# Patient Record
Sex: Female | Born: 1935 | Race: Black or African American | Hispanic: No | Marital: Single | Smoking: Never smoker
Health system: Southern US, Community
[De-identification: ages and names within clinical notes are randomized; demographics above are authoritative.]

## PROBLEM LIST (undated history)

## (undated) DIAGNOSIS — I7102 Dissection of abdominal aorta: Secondary | ICD-10-CM

## (undated) DIAGNOSIS — N189 Chronic kidney disease, unspecified: Secondary | ICD-10-CM

## (undated) DIAGNOSIS — E113599 Type 2 diabetes mellitus with proliferative diabetic retinopathy without macular edema, unspecified eye: Secondary | ICD-10-CM

## (undated) DIAGNOSIS — I5189 Other ill-defined heart diseases: Secondary | ICD-10-CM

## (undated) DIAGNOSIS — N390 Urinary tract infection, site not specified: Secondary | ICD-10-CM

## (undated) DIAGNOSIS — I10 Essential (primary) hypertension: Secondary | ICD-10-CM

## (undated) DIAGNOSIS — I739 Peripheral vascular disease, unspecified: Secondary | ICD-10-CM

## (undated) DIAGNOSIS — M8580 Other specified disorders of bone density and structure, unspecified site: Secondary | ICD-10-CM

## (undated) DIAGNOSIS — R32 Unspecified urinary incontinence: Secondary | ICD-10-CM

## (undated) DIAGNOSIS — E538 Deficiency of other specified B group vitamins: Secondary | ICD-10-CM

## (undated) DIAGNOSIS — I2510 Atherosclerotic heart disease of native coronary artery without angina pectoris: Secondary | ICD-10-CM

## (undated) DIAGNOSIS — F0390 Unspecified dementia without behavioral disturbance: Secondary | ICD-10-CM

## (undated) DIAGNOSIS — G459 Transient cerebral ischemic attack, unspecified: Secondary | ICD-10-CM

## (undated) DIAGNOSIS — E7800 Pure hypercholesterolemia, unspecified: Secondary | ICD-10-CM

## (undated) DIAGNOSIS — Z531 Procedure and treatment not carried out because of patient's decision for reasons of belief and group pressure: Secondary | ICD-10-CM

## (undated) DIAGNOSIS — M109 Gout, unspecified: Secondary | ICD-10-CM

## (undated) DIAGNOSIS — G8929 Other chronic pain: Secondary | ICD-10-CM

## (undated) DIAGNOSIS — D649 Anemia, unspecified: Secondary | ICD-10-CM

## (undated) DIAGNOSIS — H269 Unspecified cataract: Secondary | ICD-10-CM

## (undated) DIAGNOSIS — E559 Vitamin D deficiency, unspecified: Secondary | ICD-10-CM

## (undated) DIAGNOSIS — K219 Gastro-esophageal reflux disease without esophagitis: Secondary | ICD-10-CM

## (undated) DIAGNOSIS — I77819 Aortic ectasia, unspecified site: Secondary | ICD-10-CM

## (undated) DIAGNOSIS — I639 Cerebral infarction, unspecified: Secondary | ICD-10-CM

## (undated) DIAGNOSIS — M549 Dorsalgia, unspecified: Secondary | ICD-10-CM

## (undated) DIAGNOSIS — N6320 Unspecified lump in the left breast, unspecified quadrant: Secondary | ICD-10-CM

## (undated) DIAGNOSIS — R202 Paresthesia of skin: Secondary | ICD-10-CM

## (undated) DIAGNOSIS — IMO0001 Reserved for inherently not codable concepts without codable children: Secondary | ICD-10-CM

## (undated) DIAGNOSIS — E119 Type 2 diabetes mellitus without complications: Secondary | ICD-10-CM

## (undated) DIAGNOSIS — H401190 Primary open-angle glaucoma, unspecified eye, stage unspecified: Secondary | ICD-10-CM

## (undated) HISTORY — PX: OTHER SURGICAL HISTORY: SHX169

## (undated) HISTORY — PX: HIP SURGERY: SHX245

## (undated) HISTORY — PX: VEIN SURGERY: SHX48

## (undated) HISTORY — PX: EYE SURGERY: SHX253

---

## 2014-10-07 ENCOUNTER — Ambulatory Visit: Payer: Self-pay | Admitting: Emergency Medicine

## 2015-03-21 ENCOUNTER — Emergency Department: Payer: Medicare Other

## 2015-03-21 DIAGNOSIS — W1839XA Other fall on same level, initial encounter: Secondary | ICD-10-CM | POA: Diagnosis not present

## 2015-03-21 DIAGNOSIS — Y998 Other external cause status: Secondary | ICD-10-CM | POA: Diagnosis not present

## 2015-03-21 DIAGNOSIS — S4991XA Unspecified injury of right shoulder and upper arm, initial encounter: Secondary | ICD-10-CM | POA: Insufficient documentation

## 2015-03-21 DIAGNOSIS — Y9389 Activity, other specified: Secondary | ICD-10-CM | POA: Insufficient documentation

## 2015-03-21 DIAGNOSIS — Y9289 Other specified places as the place of occurrence of the external cause: Secondary | ICD-10-CM | POA: Insufficient documentation

## 2015-03-21 NOTE — ED Notes (Signed)
Pt to triage via w/c with no distress; pt st PTA bent over to pick something up and fell on right shoulder;

## 2015-03-22 ENCOUNTER — Emergency Department
Admission: EM | Admit: 2015-03-22 | Discharge: 2015-03-22 | Disposition: A | Discharge status: Home / Self Care | Payer: Medicare Other | Attending: Emergency Medicine | Admitting: Emergency Medicine

## 2015-03-22 DIAGNOSIS — M25511 Pain in right shoulder: Secondary | ICD-10-CM

## 2015-03-22 MED ORDER — OXYCODONE-ACETAMINOPHEN 5-325 MG PO TABS: 1.0000 | ORAL_TABLET | ORAL | Status: AC | PRN

## 2015-03-22 MED ORDER — OXYCODONE-ACETAMINOPHEN 5-325 MG PO TABS
1.0000 | ORAL_TABLET | Freq: Once | ORAL | Status: AC
  Administered 2015-03-22: 1 via ORAL
  Filled 2015-03-22: qty 1

## 2015-03-22 NOTE — ED Provider Notes (Signed)
[facility] Emergency Department Provider Note  ____________________________________________  Time seen: 4:20 AM  I have reviewed the triage vital signs and the nursing notes.   HISTORY  Chief Complaint Shoulder Pain      HPI Jade Baker is a 81 y.o. female presents with history of accidental fall landing on her right shoulder at approximately 10 PM last night. Patient now admits to 8 out of 10 right shoulder pain since the fall.     There are no active problems to display for this patient.    Current Outpatient Rx  Name  Route  Sig  Dispense  Refill  . oxyCODONE-acetaminophen (PERCOCET/ROXICET) 5-325 MG per tablet   Oral   Take 1 tablet by mouth every 4 (four) hours as needed for severe pain.   20 tablet   0     Allergies Codeine and Ivp dye  No family history on file.  Social History History  Substance Use Topics  . Smoking status: Not on file  . Smokeless tobacco: Not on file  . Alcohol Use: Not on file    Review of Systems  Constitutional: Negative for fever. Eyes: Negative for visual changes. ENT: Negative for sore throat. Cardiovascular: Negative for chest pain. Respiratory: Negative for shortness of breath. Gastrointestinal: Negative for abdominal pain, vomiting and diarrhea. Genitourinary: Negative for dysuria. Musculoskeletal: Negative for back pain. Positive right shoulder pain  Skin: Negative for rash. Neurological: Negative for headaches, focal weakness or numbness.   10-point ROS otherwise negative.  ____________________________________________   PHYSICAL EXAM:  VITAL SIGNS: ED Triage Vitals  Enc Vitals Group     BP 03/21/15 2348 154/69 mmHg     Pulse Rate 03/21/15 2348 94     Resp 03/21/15 2348 20     Temp 03/21/15 2348 98 F (36.7 C)     Temp Source 03/21/15 2348 Oral     SpO2 03/21/15 2348 95 %     Weight 03/21/15 2348 135 lb (61.236 kg)     Height 03/21/15 2348 5' (1.524 m)     Head  Cir --      Peak Flow --      Pain Score 03/21/15 2347 10     Pain Loc --      Pain Edu? --      Excl. in GC? --     Constitutional: Alert and oriented. Well appearing and in no distress. Eyes: Conjunctivae are normal. PERRL. Normal extraocular movements. ENT   Head: Normocephalic and atraumatic.   Nose: No congestion/rhinnorhea.   Mouth/Throat: Mucous membranes are moist.   Neck: No stridor. Hematological/Lymphatic/Immunilogical: No cervical lymphadenopathy. Cardiovascular: Normal rate, regular rhythm. Normal and symmetric distal pulses are present in all extremities. No murmurs, rubs, or gallops. Respiratory: Normal respiratory effort without tachypnea nor retractions. Breath sounds are clear and equal bilaterally. No wheezes/rales/rhonchi. Gastrointestinal: Soft and nontender. No distention. There is no CVA tenderness. Genitourinary: deferred Musculoskeletal: Nontender with normal range of motion in all extremities. No joint effusions.   Positive right shoulder pain with palpation, pain with movement of the right arm above 90 Neurologic:  Normal speech and language. No gross focal neurologic deficits are appreciated. Speech is normal.  Skin:  Skin is warm, dry and intact. No rash noted. Psychiatric: Mood and affect are normal. Speech and behavior are normal. Patient exhibits appropriate insight and judgment.    RADIOLOGY  Right shoulder x-ray revealed:     INITIAL IMPRESSION / ASSESSMENT AND PLAN / ED COURSE  Pertinent labs & imaging results that were available during my care of the patient were reviewed by me and considered in my medical decision making (see chart for details).  History of physical exam concerning for possible right shoulder contusion versus rotator cuff injury. As such I conveyed this to the patient and her family and recommended outpatient follow-up with Dr. Trilby Drummer  ____________________________________________   FINAL CLINICAL  IMPRESSION(S) / ED DIAGNOSES  Final diagnoses:  Shoulder pain, acute, right      Darci Current, MD 03/22/15 [phone]

## 2015-03-22 NOTE — ED Notes (Addendum)
Patient presents to ED with complaint of right shoulder pain, reports was using her walker, dropped her plate and when she bent over to pick up plate patient fell on floor and landed on shoulder around 10 pm last night. Denies LOC, chest pain, or abdominal pain. Patient reports painful with movement, no obvious deformity or swelling noted. Good equal bilateral radial pulses. Patient alert and oriented, family at bedside, call bell within reach.

## 2015-03-22 NOTE — ED Notes (Signed)

## 2015-03-22 NOTE — Discharge Instructions (Signed)

## 2015-08-01 ENCOUNTER — Encounter: Payer: Self-pay | Admitting: Emergency Medicine

## 2015-08-01 ENCOUNTER — Observation Stay
Admission: EM | Admit: 2015-08-01 | Discharge: 2015-08-02 | Disposition: A | Discharge status: Home / Self Care | Payer: Medicare Other | Attending: Internal Medicine | Admitting: Internal Medicine

## 2015-08-01 DIAGNOSIS — R32 Unspecified urinary incontinence: Secondary | ICD-10-CM | POA: Insufficient documentation

## 2015-08-01 DIAGNOSIS — I2510 Atherosclerotic heart disease of native coronary artery without angina pectoris: Secondary | ICD-10-CM | POA: Diagnosis not present

## 2015-08-01 DIAGNOSIS — K219 Gastro-esophageal reflux disease without esophagitis: Secondary | ICD-10-CM | POA: Insufficient documentation

## 2015-08-01 DIAGNOSIS — Z7982 Long term (current) use of aspirin: Secondary | ICD-10-CM | POA: Diagnosis not present

## 2015-08-01 DIAGNOSIS — N183 Chronic kidney disease, stage 3 (moderate): Secondary | ICD-10-CM | POA: Insufficient documentation

## 2015-08-01 DIAGNOSIS — G8929 Other chronic pain: Secondary | ICD-10-CM | POA: Diagnosis not present

## 2015-08-01 DIAGNOSIS — M549 Dorsalgia, unspecified: Secondary | ICD-10-CM | POA: Insufficient documentation

## 2015-08-01 DIAGNOSIS — R52 Pain, unspecified: Secondary | ICD-10-CM

## 2015-08-01 DIAGNOSIS — I6521 Occlusion and stenosis of right carotid artery: Secondary | ICD-10-CM | POA: Diagnosis not present

## 2015-08-01 DIAGNOSIS — I1310 Hypertensive heart and chronic kidney disease without heart failure, with stage 1 through stage 4 chronic kidney disease, or unspecified chronic kidney disease: Secondary | ICD-10-CM | POA: Insufficient documentation

## 2015-08-01 DIAGNOSIS — M109 Gout, unspecified: Secondary | ICD-10-CM | POA: Diagnosis not present

## 2015-08-01 DIAGNOSIS — R748 Abnormal levels of other serum enzymes: Secondary | ICD-10-CM | POA: Insufficient documentation

## 2015-08-01 DIAGNOSIS — E1122 Type 2 diabetes mellitus with diabetic chronic kidney disease: Secondary | ICD-10-CM | POA: Insufficient documentation

## 2015-08-01 DIAGNOSIS — Z8673 Personal history of transient ischemic attack (TIA), and cerebral infarction without residual deficits: Secondary | ICD-10-CM | POA: Insufficient documentation

## 2015-08-01 DIAGNOSIS — E559 Vitamin D deficiency, unspecified: Secondary | ICD-10-CM | POA: Diagnosis not present

## 2015-08-01 DIAGNOSIS — R55 Syncope and collapse: Secondary | ICD-10-CM | POA: Diagnosis present

## 2015-08-01 DIAGNOSIS — E7800 Pure hypercholesterolemia, unspecified: Secondary | ICD-10-CM | POA: Insufficient documentation

## 2015-08-01 DIAGNOSIS — E785 Hyperlipidemia, unspecified: Secondary | ICD-10-CM | POA: Diagnosis not present

## 2015-08-01 DIAGNOSIS — M8580 Other specified disorders of bone density and structure, unspecified site: Secondary | ICD-10-CM | POA: Diagnosis not present

## 2015-08-01 DIAGNOSIS — H401190 Primary open-angle glaucoma, unspecified eye, stage unspecified: Secondary | ICD-10-CM | POA: Insufficient documentation

## 2015-08-01 DIAGNOSIS — R778 Other specified abnormalities of plasma proteins: Secondary | ICD-10-CM

## 2015-08-01 DIAGNOSIS — Z7951 Long term (current) use of inhaled steroids: Secondary | ICD-10-CM | POA: Insufficient documentation

## 2015-08-01 DIAGNOSIS — F0390 Unspecified dementia without behavioral disturbance: Secondary | ICD-10-CM | POA: Diagnosis not present

## 2015-08-01 DIAGNOSIS — Z7901 Long term (current) use of anticoagulants: Secondary | ICD-10-CM | POA: Diagnosis not present

## 2015-08-01 DIAGNOSIS — Z79899 Other long term (current) drug therapy: Secondary | ICD-10-CM | POA: Diagnosis not present

## 2015-08-01 DIAGNOSIS — R7989 Other specified abnormal findings of blood chemistry: Secondary | ICD-10-CM | POA: Diagnosis present

## 2015-08-01 DIAGNOSIS — I739 Peripheral vascular disease, unspecified: Secondary | ICD-10-CM | POA: Insufficient documentation

## 2015-08-01 DIAGNOSIS — R110 Nausea: Secondary | ICD-10-CM | POA: Insufficient documentation

## 2015-08-01 HISTORY — DX: Pure hypercholesterolemia, unspecified: E78.00

## 2015-08-01 HISTORY — DX: Unspecified cataract: H26.9

## 2015-08-01 HISTORY — DX: Urinary tract infection, site not specified: N39.0

## 2015-08-01 HISTORY — DX: Other chronic pain: G89.29

## 2015-08-01 HISTORY — DX: Reserved for inherently not codable concepts without codable children: IMO0001

## 2015-08-01 HISTORY — DX: Procedure and treatment not carried out because of patient's decision for reasons of belief and group pressure: Z53.1

## 2015-08-01 HISTORY — DX: Gastro-esophageal reflux disease without esophagitis: K21.9

## 2015-08-01 HISTORY — DX: Vitamin D deficiency, unspecified: E55.9

## 2015-08-01 HISTORY — DX: Dorsalgia, unspecified: M54.9

## 2015-08-01 HISTORY — DX: Atherosclerotic heart disease of native coronary artery without angina pectoris: I25.10

## 2015-08-01 HISTORY — DX: Type 2 diabetes mellitus without complications: E11.9

## 2015-08-01 HISTORY — DX: Chronic kidney disease, unspecified: N18.9

## 2015-08-01 HISTORY — DX: Other specified disorders of bone density and structure, unspecified site: M85.80

## 2015-08-01 HISTORY — DX: Unspecified urinary incontinence: R32

## 2015-08-01 HISTORY — DX: Other ill-defined heart diseases: I51.89

## 2015-08-01 HISTORY — DX: Peripheral vascular disease, unspecified: I73.9

## 2015-08-01 HISTORY — DX: Unspecified dementia, unspecified severity, without behavioral disturbance, psychotic disturbance, mood disturbance, and anxiety: F03.90

## 2015-08-01 HISTORY — DX: Unspecified lump in the left breast, unspecified quadrant: N63.20

## 2015-08-01 HISTORY — DX: Gout, unspecified: M10.9

## 2015-08-01 HISTORY — DX: Essential (primary) hypertension: I10

## 2015-08-01 HISTORY — DX: Cerebral infarction, unspecified: I63.9

## 2015-08-01 HISTORY — DX: Aortic ectasia, unspecified site: I77.819

## 2015-08-01 HISTORY — DX: Deficiency of other specified B group vitamins: E53.8

## 2015-08-01 HISTORY — DX: Type 2 diabetes mellitus with proliferative diabetic retinopathy without macular edema, unspecified eye: E11.3599

## 2015-08-01 HISTORY — DX: Dissection of abdominal aorta: I71.02

## 2015-08-01 HISTORY — DX: Transient cerebral ischemic attack, unspecified: G45.9

## 2015-08-01 HISTORY — DX: Primary open-angle glaucoma, unspecified eye, stage unspecified: H40.1190

## 2015-08-01 HISTORY — DX: Paresthesia of skin: R20.2

## 2015-08-01 HISTORY — DX: Anemia, unspecified: D64.9

## 2015-08-01 LAB — CBC WITH DIFFERENTIAL/PLATELET
Basophils Absolute: 0.1 10*3/uL (ref 0–0.1)
Basophils Relative: 1 %
EOS ABS: 0.2 10*3/uL (ref 0–0.7)
Eosinophils Relative: 3 %
HCT: 25.7 % — ABNORMAL LOW (ref 35.0–47.0)
Hemoglobin: 8.5 g/dL — ABNORMAL LOW (ref 12.0–16.0)
Lymphocytes Relative: 30 %
Lymphs Abs: 2.4 10*3/uL (ref 1.0–3.6)
MCH: 26.8 pg (ref 26.0–34.0)
MCHC: 33.1 g/dL (ref 32.0–36.0)
MCV: 80.9 fL (ref 80.0–100.0)
Monocytes Absolute: 0.7 10*3/uL (ref 0.2–0.9)
Monocytes Relative: 9 %
Neutro Abs: 4.5 10*3/uL (ref 1.4–6.5)
Neutrophils Relative %: 57 %
Platelets: 232 10*3/uL (ref 150–440)
RBC: 3.18 MIL/uL — AB (ref 3.80–5.20)
RDW: 14.6 % — ABNORMAL HIGH (ref 11.5–14.5)
WBC: 7.9 10*3/uL (ref 3.6–11.0)

## 2015-08-01 LAB — URINALYSIS COMPLETE WITH MICROSCOPIC (ARMC ONLY)
Bilirubin Urine: NEGATIVE
GLUCOSE, UA: NEGATIVE mg/dL
Hgb urine dipstick: NEGATIVE
Ketones, ur: NEGATIVE mg/dL
Leukocytes, UA: NEGATIVE
Nitrite: NEGATIVE
Protein, ur: 100 mg/dL — AB
Specific Gravity, Urine: 1.01 (ref 1.005–1.030)
pH: 8 (ref 5.0–8.0)

## 2015-08-01 LAB — CBC
HEMATOCRIT: 24.8 % — AB (ref 35.0–47.0)
Hemoglobin: 8.1 g/dL — ABNORMAL LOW (ref 12.0–16.0)
MCH: 26.8 pg (ref 26.0–34.0)
MCHC: 32.9 g/dL (ref 32.0–36.0)
MCV: 81.3 fL (ref 80.0–100.0)
Platelets: 223 10*3/uL (ref 150–440)
RBC: 3.04 MIL/uL — ABNORMAL LOW (ref 3.80–5.20)
RDW: 14.6 % — AB (ref 11.5–14.5)
WBC: 7 10*3/uL (ref 3.6–11.0)

## 2015-08-01 LAB — COMPREHENSIVE METABOLIC PANEL
ALBUMIN: 4.1 g/dL (ref 3.5–5.0)
ALK PHOS: 78 U/L (ref 38–126)
ALT: 11 U/L — ABNORMAL LOW (ref 14–54)
AST: 22 U/L (ref 15–41)
Anion gap: 8 (ref 5–15)
BUN: 49 mg/dL — ABNORMAL HIGH (ref 6–20)
CALCIUM: 9.7 mg/dL (ref 8.9–10.3)
CO2: 24 mmol/L (ref 22–32)
Chloride: 106 mmol/L (ref 101–111)
Creatinine, Ser: 2.78 mg/dL — ABNORMAL HIGH (ref 0.44–1.00)
GFR calc Af Amer: 17 mL/min — ABNORMAL LOW (ref >60)
GFR calc non Af Amer: 15 mL/min — ABNORMAL LOW (ref >60)
Glucose, Bld: 204 mg/dL — ABNORMAL HIGH (ref 65–99)
Potassium: 4.9 mmol/L (ref 3.5–5.1)
SODIUM: 138 mmol/L (ref 135–145)
Total Bilirubin: 0.3 mg/dL (ref 0.3–1.2)
Total Protein: 7.6 g/dL (ref 6.5–8.1)

## 2015-08-01 LAB — TROPONIN I
TROPONIN I: 0.04 ng/mL — AB (ref <0.031)
Troponin I: 0.04 ng/mL — ABNORMAL HIGH (ref <0.031)
Troponin I: 0.05 ng/mL — ABNORMAL HIGH (ref <0.031)

## 2015-08-01 LAB — CREATININE, SERUM
Creatinine, Ser: 2.67 mg/dL — ABNORMAL HIGH (ref 0.44–1.00)
GFR calc Af Amer: 18 mL/min — ABNORMAL LOW (ref >60)
GFR, EST NON AFRICAN AMERICAN: 16 mL/min — AB (ref >60)

## 2015-08-01 MED ORDER — PREDNISOLONE ACETATE 1 % OP SUSP
1.0000 [drp] | Freq: Four times a day (QID) | OPHTHALMIC | Status: DC
  Administered 2015-08-01 – 2015-08-02 (×2): 1 [drp] via OPHTHALMIC
  Filled 2015-08-01: qty 1

## 2015-08-01 MED ORDER — ACETAMINOPHEN 325 MG PO TABS: 650.0000 mg | ORAL_TABLET | Freq: Four times a day (QID) | ORAL | Status: DC | PRN

## 2015-08-01 MED ORDER — ACETAMINOPHEN 325 MG PO TABS: 650.0000 mg | ORAL_TABLET | ORAL | Status: DC | PRN

## 2015-08-01 MED ORDER — FLUTICASONE PROPIONATE 50 MCG/ACT NA SUSP
1.0000 | Freq: Every day | NASAL | Status: DC | PRN
  Filled 2015-08-01: qty 16

## 2015-08-01 MED ORDER — FERROUS SULFATE 325 (65 FE) MG PO TABS
325.0000 mg | ORAL_TABLET | Freq: Two times a day (BID) | ORAL | Status: DC
  Administered 2015-08-02: 325 mg via ORAL
  Filled 2015-08-01: qty 1

## 2015-08-01 MED ORDER — SODIUM CHLORIDE 0.9 % IJ SOLN
3.0000 mL | Freq: Two times a day (BID) | INTRAMUSCULAR | Status: DC
  Administered 2015-08-01: 22:00:00 3 mL via INTRAVENOUS

## 2015-08-01 MED ORDER — HYDRALAZINE HCL 25 MG PO TABS
50.0000 mg | ORAL_TABLET | Freq: Two times a day (BID) | ORAL | Status: DC
  Administered 2015-08-01 – 2015-08-02 (×2): 50 mg via ORAL
  Filled 2015-08-01 (×2): qty 2

## 2015-08-01 MED ORDER — HEPARIN SODIUM (PORCINE) 5000 UNIT/ML IJ SOLN
5000.0000 [IU] | Freq: Three times a day (TID) | INTRAMUSCULAR | Status: DC
  Administered 2015-08-01 – 2015-08-02 (×2): 5000 [IU] via SUBCUTANEOUS
  Filled 2015-08-01 (×2): qty 1

## 2015-08-01 MED ORDER — CALCIUM CARBONATE ANTACID 500 MG PO CHEW
600.0000 mg | CHEWABLE_TABLET | Freq: Every day | ORAL | Status: DC
  Administered 2015-08-02: 13:00:00 600 mg via ORAL
  Filled 2015-08-01: qty 3

## 2015-08-01 MED ORDER — PAROXETINE HCL 20 MG PO TABS
20.0000 mg | ORAL_TABLET | Freq: Every day | ORAL | Status: DC
Start: 2015-08-01 — End: 2015-08-02
  Administered 2015-08-02: 20 mg via ORAL
  Filled 2015-08-01: qty 1

## 2015-08-01 MED ORDER — MELATONIN 3 MG PO TABS: 1.0000 | ORAL_TABLET | Freq: Every day | ORAL | Status: DC

## 2015-08-01 MED ORDER — MEMANTINE HCL 10 MG PO TABS
10.0000 mg | ORAL_TABLET | Freq: Two times a day (BID) | ORAL | Status: DC
  Administered 2015-08-01 – 2015-08-02 (×2): 10 mg via ORAL
  Filled 2015-08-01 (×2): qty 1

## 2015-08-01 MED ORDER — ONDANSETRON HCL 4 MG PO TABS: 4.0000 mg | ORAL_TABLET | Freq: Four times a day (QID) | ORAL | Status: DC | PRN

## 2015-08-01 MED ORDER — LATANOPROST 0.005 % OP SOLN
1.0000 [drp] | Freq: Every day | OPHTHALMIC | Status: DC
  Administered 2015-08-01: 22:00:00 1 [drp] via OPHTHALMIC
  Filled 2015-08-01: qty 2.5

## 2015-08-01 MED ORDER — SODIUM CHLORIDE 0.9 % IV SOLN
1000.0000 mL | Freq: Once | INTRAVENOUS | Status: AC
  Administered 2015-08-01: 1000 mL via INTRAVENOUS

## 2015-08-01 MED ORDER — DONEPEZIL HCL 5 MG PO TABS
5.0000 mg | ORAL_TABLET | Freq: Every day | ORAL | Status: DC
  Administered 2015-08-01: 5 mg via ORAL
  Filled 2015-08-01: qty 1

## 2015-08-01 MED ORDER — CILOSTAZOL 100 MG PO TABS
100.0000 mg | ORAL_TABLET | Freq: Two times a day (BID) | ORAL | Status: DC
  Administered 2015-08-01 – 2015-08-02 (×2): 100 mg via ORAL
  Filled 2015-08-01 (×3): qty 1

## 2015-08-01 MED ORDER — ISOSORBIDE MONONITRATE ER 30 MG PO TB24
60.0000 mg | ORAL_TABLET | Freq: Every day | ORAL | Status: DC
  Administered 2015-08-02: 60 mg via ORAL
  Filled 2015-08-01: qty 2

## 2015-08-01 MED ORDER — ACETAMINOPHEN 650 MG RE SUPP: 650.0000 mg | Freq: Four times a day (QID) | RECTAL | Status: DC | PRN

## 2015-08-01 MED ORDER — CLOPIDOGREL BISULFATE 75 MG PO TABS
75.0000 mg | ORAL_TABLET | Freq: Every day | ORAL | Status: DC
  Administered 2015-08-02: 13:00:00 75 mg via ORAL
  Filled 2015-08-01: qty 1

## 2015-08-01 MED ORDER — CIPROFLOXACIN HCL 0.3 % OP SOLN
1.0000 [drp] | Freq: Four times a day (QID) | OPHTHALMIC | Status: DC
  Administered 2015-08-01 – 2015-08-02 (×2): 1 [drp] via OPHTHALMIC
  Filled 2015-08-01: qty 2.5

## 2015-08-01 MED ORDER — FEBUXOSTAT 40 MG PO TABS
40.0000 mg | ORAL_TABLET | Freq: Every day | ORAL | Status: DC
  Administered 2015-08-02: 40 mg via ORAL
  Filled 2015-08-01: qty 1

## 2015-08-01 MED ORDER — ASPIRIN EC 81 MG PO TBEC
81.0000 mg | DELAYED_RELEASE_TABLET | Freq: Every day | ORAL | Status: DC
  Administered 2015-08-02: 13:00:00 81 mg via ORAL
  Filled 2015-08-01: qty 1

## 2015-08-01 MED ORDER — ONDANSETRON HCL 4 MG/2ML IJ SOLN: 4.0000 mg | Freq: Four times a day (QID) | INTRAMUSCULAR | Status: DC | PRN

## 2015-08-01 MED ORDER — SIMETHICONE 80 MG PO CHEW: 80.0000 mg | CHEWABLE_TABLET | Freq: Four times a day (QID) | ORAL | Status: DC | PRN

## 2015-08-01 MED ORDER — PRAVASTATIN SODIUM 40 MG PO TABS: 40.0000 mg | ORAL_TABLET | Freq: Every day | ORAL | Status: DC

## 2015-08-01 MED ORDER — BRIMONIDINE TARTRATE 0.2 % OP SOLN
1.0000 [drp] | Freq: Three times a day (TID) | OPHTHALMIC | Status: DC
  Administered 2015-08-01 – 2015-08-02 (×2): 1 [drp] via OPHTHALMIC
  Filled 2015-08-01: qty 5

## 2015-08-01 MED ORDER — VITAMIN D 1000 UNITS PO TABS
1000.0000 [IU] | ORAL_TABLET | Freq: Every day | ORAL | Status: DC
  Administered 2015-08-02: 13:00:00 1000 [IU] via ORAL
  Filled 2015-08-01: qty 1

## 2015-08-01 MED ORDER — PANTOPRAZOLE SODIUM 40 MG PO TBEC
40.0000 mg | DELAYED_RELEASE_TABLET | Freq: Two times a day (BID) | ORAL | Status: DC
  Administered 2015-08-01 – 2015-08-02 (×2): 40 mg via ORAL
  Filled 2015-08-01 (×2): qty 1

## 2015-08-01 NOTE — ED Notes (Signed)
Pt aware of need for urine. Will call when able to go. Instructed not to get up on own.

## 2015-08-01 NOTE — ED Notes (Signed)
Pt was at home and had syncopal episode. Daughter caught so pt did not fall. Denies pain or weakness at this time. Hypotensive in 80s systolic initially by ems. Normotensive with last ems bp

## 2015-08-01 NOTE — H&P (Addendum)
[facility] - [place] at [facility]   PATIENT NAME: Jade Baker    MR#:  [id number]  DATE OF BIRTH:  23-Jun-1931  DATE OF ADMISSION:  08/01/2015  PRIMARY CARE PHYSICIAN: Cyndie Mull, DO   REQUESTING/REFERRING PHYSICIAN: Dr. Chari Manning  CHIEF COMPLAINT:   Syncope.   HISTORY OF PRESENT ILLNESS:  Jade Baker  is a 81 y.o. female with a known history of hypertension, chronic kidney disease stage III, chronic back pain, history of gout, peripheral vascular disease, TIA, urinary incontinence, cataracts, osteoporosis, GERD, history of previous CVA, gout presented to the hospital due to a syncopal episode. Patient says that she was feeling a bit nauseated this morning and then decided to get herself a drink. She pulled herself some ginger ale and the next and she remembers is her niece trying to shake her to get her up. She denies any chest pain, shortness of breath, fevers chills cough, palpitations, or any other associated symptoms prior to her syncopal episode. She said she had a similar episode like this about a year and a half ago. Presently she is hemodynamically stable and denies any acute complaints. Hospitalist services were contacted further treatment and evaluation.  PAST MEDICAL HISTORY:   Past Medical History  Diagnosis Date  . Diabetes mellitus without complication (HCC)   . Hypertension   . CAD (coronary artery disease)   . Chronic back pain   . Hypercholesteremia   . Gout   . PAD (peripheral artery disease) (HCC)   . TIA (transient ischemic attack)   . CKD (chronic kidney disease)   . Urinary incontinence   . Refusal of blood transfusions as patient is Jehovah's Witness   . UTI (lower urinary tract infection)   . PDR (proliferative diabetic retinopathy) (HCC)   . Cataract   . POAG (primary open-angle glaucoma)   . Osteopenia   . Vitamin D deficiency   . Dementia   . Left breast mass   . Anemia   . Aortic ectasia (HCC)   .  Aortic dissection, abdominal (HCC)   . Diastolic dysfunction   . GERD (gastroesophageal reflux disease)   . Vitamin B 12 deficiency   . Leg paresthesia   . Stroke [facility])     PAST SURGICAL HISTORY:   Past Surgical History  Procedure Laterality Date  . Hip surgery    . Vein surgery      per pt from when had stroke  . Eye surgery    . Stimulator for bladder      SOCIAL HISTORY:   Social History  Substance Use Topics  . Smoking status: Never Smoker   . Smokeless tobacco: Not on file  . Alcohol Use: No    FAMILY HISTORY:   Family History  Problem Relation Age of Onset  . Diabetes Mother   . Hypertension Sister   . Diabetes Brother     DRUG ALLERGIES:   Allergies  Allergen Reactions  . Apple Juice [Apple] Anaphylaxis  . Beta Adrenergic Blockers Other (See Comments)    Reaction: Symptomatic bradycardia and hypotension  . Codeine Other (See Comments)    Reaction: Hallucinations  . Hydrochlorothiazide Other (See Comments)    Reaction: Muscle pain   . Niacin And Related Other (See Comments)    Family unsure rxn   . Rofecoxib Other (See Comments)    Family unsure rxn  . Zetia [Ezetimibe] Other (See Comments)    Family unsure rxn  . Zocor [Simvastatin] Other (See Comments)  Reaction: Myalgias  . Iodine Rash  . Ivp Dye [Iodinated Diagnostic Agents] Rash  . Lipitor [Atorvastatin] Other (See Comments)    Reaction: Muscle pain    REVIEW OF SYSTEMS:   Review of Systems  Constitutional: Negative for fever and weight loss.  HENT: Negative for congestion, nosebleeds and tinnitus.   Eyes: Negative for blurred vision, double vision and redness.  Respiratory: Negative for cough, hemoptysis and shortness of breath.   Cardiovascular: Negative for chest pain, orthopnea, leg swelling and PND.       Positive syncope  Gastrointestinal: Negative for nausea, vomiting, abdominal pain, diarrhea and melena.  Genitourinary: Negative for dysuria, urgency and hematuria.   Musculoskeletal: Negative for joint pain and falls.  Neurological: Negative for dizziness, tingling, sensory change, focal weakness, seizures, weakness and headaches.  Endo/Heme/Allergies: Negative for polydipsia. Does not bruise/bleed easily.  Psychiatric/Behavioral: Negative for depression and memory loss. The patient is not nervous/anxious.     MEDICATIONS AT HOME:   Prior to Admission medications   Medication Sig Start Date End Date Taking? Authorizing Provider  acetaminophen (TYLENOL) 325 MG tablet Take 650 mg by mouth every 4 (four) hours as needed for moderate pain.   Yes Historical Provider, MD  aspirin EC 81 MG tablet Take 81 mg by mouth daily.   Yes Historical Provider, MD  brimonidine (ALPHAGAN P) 0.1 % SOLN Place 1 drop into both eyes 3 (three) times daily.   Yes Historical Provider, MD  calcium carbonate (OSCAL) 1500 (600 CA) MG TABS tablet Take 600 mg of elemental calcium by mouth daily.   Yes Historical Provider, MD  cilostazol (PLETAL) 100 MG tablet Take 100 mg by mouth 2 (two) times daily.   Yes Historical Provider, MD  ciprofloxacin (CILOXAN) 0.3 % ophthalmic solution Place 1 drop into the right eye 4 (four) times daily. Administer 1 drop, every 2 hours, while awake, for 2 days. Then 1 drop, every 4 hours, while awake, for the next 5 days.   Yes Historical Provider, MD  clopidogrel (PLAVIX) 75 MG tablet Take 75 mg by mouth daily.   Yes Historical Provider, MD  donepezil (ARICEPT) 5 MG tablet Take 5 mg by mouth at bedtime.   Yes Historical Provider, MD  febuxostat (ULORIC) 40 MG tablet Take 40 mg by mouth daily.   Yes Historical Provider, MD  ferrous sulfate 325 (65 FE) MG tablet Take 325 mg by mouth 2 (two) times daily with a meal.   Yes Historical Provider, MD  fluticasone (FLONASE) 50 MCG/ACT nasal spray Place 1 spray into both nostrils daily as needed for allergies or rhinitis.   Yes Historical Provider, MD  hydrALAZINE (APRESOLINE) 50 MG tablet Take 50 mg by mouth 2 (two)  times daily.   Yes Historical Provider, MD  isosorbide mononitrate (IMDUR) 60 MG 24 hr tablet Take 60 mg by mouth daily.   Yes Historical Provider, MD  latanoprost (XALATAN) 0.005 % ophthalmic solution Place 1 drop into both eyes at bedtime.   Yes Historical Provider, MD  lidocaine (XYLOCAINE) 2 % jelly Apply 1 application topically as needed (for eye).   Yes Historical Provider, MD  lovastatin (MEVACOR) 40 MG tablet Take 40 mg by mouth daily.   Yes Historical Provider, MD  Melatonin 3 MG TABS Take 1 tablet by mouth at bedtime.   Yes Historical Provider, MD  memantine (NAMENDA) 10 MG tablet Take 10 mg by mouth 2 (two) times daily.   Yes Historical Provider, MD  pantoprazole (PROTONIX) 40 MG tablet Take 40  mg by mouth every 12 (twelve) hours.   Yes Historical Provider, MD  PARoxetine (PAXIL) 20 MG tablet Take 20 mg by mouth daily.   Yes Historical Provider, MD  prednisoLONE acetate (PRED FORTE) 1 % ophthalmic suspension Place 1 drop into the right eye 4 (four) times daily.   Yes Historical Provider, MD  simethicone (MYLICON) 80 MG chewable tablet Chew 80 mg by mouth 4 (four) times daily as needed for flatulence.   Yes Historical Provider, MD  Vitamin D, Cholecalciferol, 1000 UNITS CAPS Take 1 capsule by mouth daily.   Yes Historical Provider, MD  oxyCODONE-acetaminophen (PERCOCET/ROXICET) 5-325 MG per tablet Take 1 tablet by mouth every 4 (four) hours as needed for severe pain. Patient not taking: Reported on 08/01/2015 03/22/15   Darci Current, MD      VITAL SIGNS:  Blood pressure 150/65, pulse 90, temperature 97.5 F (36.4 C), temperature source Oral, resp. rate 24, height 5' (1.524 m), weight 54.885 kg (121 lb), SpO2 96 %.  PHYSICAL EXAMINATION:  Physical Exam  GENERAL:  81 y.o.-year-old patient lying in the bed in no acute distress.  EYES: Pupils equal, round, reactive to light and accommodation. No scleral icterus. Extraocular muscles intact.  HEENT: Head atraumatic, normocephalic.  Oropharynx and nasopharynx clear. No oropharyngeal erythema, moist oral mucosa  NECK:  Supple, no jugular venous distention. No thyroid enlargement, no tenderness.  LUNGS: Normal breath sounds bilaterally, no wheezing, rales, rhonchi. No use of accessory muscles of respiration.  CARDIOVASCULAR: S1, S2 RRR. No murmurs, rubs, gallops, clicks.  ABDOMEN: Soft, nontender, nondistended. Bowel sounds present. No organomegaly or mass.  EXTREMITIES: No pedal edema, cyanosis, or clubbing. + 2 pedal & radial pulses b/l.   NEUROLOGIC: Cranial nerves II through XII are intact. No focal Motor or sensory deficits appreciated b/l PSYCHIATRIC: The patient is alert and oriented x 3. Good affect.  SKIN: No obvious rash, lesion, or ulcer.   LABORATORY PANEL:   CBC  Recent Labs Lab 08/01/15 1106  WBC 7.9  HGB 8.5*  HCT 25.7*  PLT 232   ------------------------------------------------------------------------------------------------------------------  Chemistries   Recent Labs Lab 08/01/15 1106  NA 138  K 4.9  CL 106  CO2 24  GLUCOSE 204*  BUN 49*  CREATININE 2.78*  CALCIUM 9.7  AST 22  ALT 11*  ALKPHOS 78  BILITOT 0.3   ------------------------------------------------------------------------------------------------------------------  Cardiac Enzymes  Recent Labs Lab 08/01/15 1751  TROPONINI 0.04*   ------------------------------------------------------------------------------------------------------------------  RADIOLOGY:  No results found.   IMPRESSION AND PLAN:   81 year old female with past medical history of hypertension, gout, chronic kidney disease stage III, dementia, GERD, glaucoma, history of previous CVA, osteoporosis who presented to the hospital due to a syncopal episode.  #1 syncope-the exact etiology of the syncope is unclear. Suspect this is likely vasovagal in nature. -Patient had a nausea episode and shortly thereafter had a presyncopal episode. -Patient  does have significant risk factors given her history of hypertension, chronic kidney disease, peripheral vascular disease. I will therefore observe overnight on telemetry, cycle her cardiac markers. -Check a two-dimensional echocardiogram, carotid duplex. -I will also check orthostatic vital signs.  #2 hypertension-continue hydralazine, Imdur. Next  #3 history of gout-no acute attack. -Continue Uloric.   #4 dementia-continue Namenda, Aricept.  #5 peripheral vascular disease-continue Pletal.  #6 glaucoma-continue Alphagan eyedrops.  #7 GERD-continue Protonix.  #8 hyperlipidemia-continue Pravachol.   All the records are reviewed and case discussed with ED provider. Management plans discussed with the patient, family and they are in agreement.  CODE STATUS: Full  TOTAL TIME TAKING CARE OF THIS PATIENT: 45 minutes.    Houston SirenSAINANI,Yamil Dougher J M.D on 08/01/2015 at 7:29 PM  Between 7am to 6pm - Pager - [phone]  After 6pm go to [email or web address] - password [email or web address] [facility]  [facility]  Office  [phone]  CC: Primary care physician; Luvenia StarchSANTAYANA, GLORIA PATRICIA, DO

## 2015-08-01 NOTE — Progress Notes (Signed)

## 2015-08-01 NOTE — ED Provider Notes (Signed)
-----------------------------------------   6:47 PM on 08/01/2015 -----------------------------------------  [facility] however on further discussions with the transfer center, the patient is unlikely to get a bed anytime soon. She is amenable to being admitted to [facility]. I discussed the case with the hospitalist, Dr. Cherlynn KaiserSainani, at this time for admission.  Gayla DossEryka A Iysha Mishkin, MD 08/01/15 [phone]

## 2015-08-01 NOTE — ED Notes (Signed)
[facility]

## 2015-08-01 NOTE — ED Notes (Signed)
Pt eating

## 2015-08-01 NOTE — ED Notes (Signed)
Dr Mayford Knifewilliams aware pt and family requesting to be [facility] to [facility]

## 2015-08-01 NOTE — ED Notes (Signed)
Meal tray ordered 

## 2015-08-01 NOTE — ED Provider Notes (Addendum)
[facility] Emergency Department Provider Note     Time seen: ----------------------------------------- 11:04 AM on 08/01/2015 -----------------------------------------    I have reviewed the triage vital signs and the nursing notes.   HISTORY  Chief Complaint Near Syncope    HPI Jade Baker is a 81 y.o. female resents ER for syncopal episode. Daughter caught her so she did not fall. She was sitting on her rolling walker and feeling very nauseated. Patient states she then almost passed out and again her daughter caught her so she hurt herself. EMS arrived and she was hypotensive with systolic blood pressure in the 80s. Patient denies any complaints or currently.   No past medical history on file.  There are no active problems to display for this patient.   No past surgical history on file.  Allergies Codeine and Ivp dye  Social History Social History  Substance Use Topics  . Smoking status: Not on file  . Smokeless tobacco: Not on file  . Alcohol Use: Not on file    Review of Systems Constitutional: Negative for fever. Eyes: Negative for visual changes. ENT: Negative for sore throat. Cardiovascular: Negative for chest pain. Positive for near syncope  Respiratory: Negative for shortness of breath. Gastrointestinal: Negative for abdominal pain, vomiting and diarrhea. Positive for nausea Genitourinary: Negative for dysuria. Musculoskeletal: Negative for back pain. Skin: Negative for rash. Neurological: Negative for headaches, focal weakness or numbness.  10-point ROS otherwise negative.  ____________________________________________   PHYSICAL EXAM:  VITAL SIGNS: ED Triage Vitals  Enc Vitals Group     BP 08/01/15 1051 111/55 mmHg     Pulse Rate 08/01/15 1051 85     Resp 08/01/15 1051 17     Temp 08/01/15 1051 97.5 F (36.4 C)     Temp Source 08/01/15 1051 Oral     SpO2 08/01/15 1051 95 %     Weight 08/01/15 1051 121  lb (54.885 kg)     Height 08/01/15 1051 5' (1.524 m)     Head Cir --      Peak Flow --      Pain Score --      Pain Loc --      Pain Edu? --      Excl. in GC? --     Constitutional: Alert and oriented. Well appearing and in no distress. Eyes: Conjunctivae are normal. PERRL. Normal extraocular movements. ENT   Head: Normocephalic and atraumatic.   Nose: No congestion/rhinnorhea.   Mouth/Throat: Mucous membranes are moist.   Neck: No stridor. Cardiovascular: Normal rate, regular rhythm. Normal and symmetric distal pulses are present in all extremities. No murmurs, rubs, or gallops. Respiratory: Normal respiratory effort without tachypnea nor retractions. Breath sounds are clear and equal bilaterally. No wheezes/rales/rhonchi. Gastrointestinal: Soft and nontender. No distention. No abdominal bruits.  Musculoskeletal: Nontender with normal range of motion in all extremities. No joint effusions.  No lower extremity tenderness nor edema. Neurologic:  Normal speech and language. No gross focal neurologic deficits are appreciated. Speech is normal. No gait instability. Skin:  Skin is warm, dry and intact. No rash noted. Psychiatric: Mood and affect are normal. Speech and behavior are normal. Patient exhibits appropriate insight and judgment. ____________________________________________  EKG: Interpreted by me. Sinus rhythm with rate of 79 bpm, normal PR interval, normal QS with, normal QT interval. Nonspecific T-wave changes, LVH, septal Q waves.  ____________________________________________  ED COURSE:  Pertinent labs & imaging results that were available during my care of the patient  were reviewed by me and considered in my medical decision making (see chart for details). Patient is in no acute distress, will need basic labs, EKG and reevaluation. We'll also check orthostatics and a liter saline. ____________________________________________    LABS (pertinent  positives/negatives)  Labs Reviewed  CBC WITH DIFFERENTIAL/PLATELET - Abnormal; Notable for the following:    RBC 3.18 (*)    Hemoglobin 8.5 (*)    HCT 25.7 (*)    RDW 14.6 (*)    All other components within normal limits  COMPREHENSIVE METABOLIC PANEL - Abnormal; Notable for the following:    Glucose, Bld 204 (*)    BUN 49 (*)    Creatinine, Ser 2.78 (*)    ALT 11 (*)    GFR calc non Af Amer 15 (*)    GFR calc Af Amer 17 (*)    All other components within normal limits  TROPONIN I - Abnormal; Notable for the following:    Troponin I 0.04 (*)    All other components within normal limits  URINALYSIS COMPLETEWITH MICROSCOPIC ([facility] ONLY) - Abnormal; Notable for the following:    Color, Urine YELLOW (*)    APPearance CLOUDY (*)    Protein, ur 100 (*)    Bacteria, UA RARE (*)    Squamous Epithelial / LPF 0-5 (*)    All other components within normal limits   ____________________________________________  FINAL ASSESSMENT AND PLAN  Syncope, mildly elevated troponin  Plan: Patient with labs and imaging as dictated above. Possible vasovagal event. Troponin is mildly elevated, would recommend observation to ensure improvement in her symptoms and serial troponin. Patient is anemic, but declines blood transfusion due to being Jehovah's Witness.   Emily FilbertWilliams, Jonathan E, MD   Emily FilbertJonathan E Williams, MD 08/01/15 1215  Emily FilbertJonathan E Williams, MD 08/01/15 [phone]

## 2015-08-01 NOTE — ED Notes (Signed)
Dr. Williams notified of critical trop of 0.04 

## 2015-08-01 NOTE — ED Notes (Signed)
Pt remains alert and oriented.  Family remains at bedside.  Still [facility].  NAD. Skin warm dry and pink. Bed remains low position.  No pain. Respirations unlabored.

## 2015-08-02 ENCOUNTER — Observation Stay: Payer: Medicare Other

## 2015-08-02 ENCOUNTER — Observation Stay
Admit: 2015-08-02 | Discharge: 2015-08-02 | Disposition: A | Discharge status: Home / Self Care | Payer: Medicare Other | Attending: Specialist | Admitting: Specialist

## 2015-08-02 DIAGNOSIS — R55 Syncope and collapse: Secondary | ICD-10-CM | POA: Diagnosis not present

## 2015-08-02 LAB — URINALYSIS COMPLETE WITH MICROSCOPIC (ARMC ONLY)
Bilirubin Urine: NEGATIVE
Glucose, UA: NEGATIVE mg/dL
HGB URINE DIPSTICK: NEGATIVE
Ketones, ur: NEGATIVE mg/dL
LEUKOCYTES UA: NEGATIVE
NITRITE: NEGATIVE
PH: 8 (ref 5.0–8.0)
PROTEIN: 100 mg/dL — AB
SPECIFIC GRAVITY, URINE: 1.009 (ref 1.005–1.030)

## 2015-08-02 LAB — TROPONIN I: Troponin I: 0.05 ng/mL — ABNORMAL HIGH (ref <0.031)

## 2015-08-02 MED ORDER — CIPROFLOXACIN HCL 500 MG PO TABS: 500.0000 mg | ORAL_TABLET | Freq: Two times a day (BID) | ORAL | Status: AC

## 2015-08-02 NOTE — Progress Notes (Signed)
Note history of diabetes. Please consider checking CBG's tid with meals and HS and add Novolog correction sensitive.  Thanks, Beryl MeagerJenny Abbegail Matuska, RN, BC-ADM Inpatient Diabetes Coordinator Pager [phone] (8a-5p)

## 2015-08-02 NOTE — Plan of Care (Signed)
Problem: Education: Goal: Knowledge of [facility] Education information/materials will improve Outcome: Progressing Pt likes to be called Mrs Jimmey Ralpharker.  Pt had Right eye surgery (cataracts surgery) on last Tuesday.    Past Medical History   Diagnosis  Date   .  Diabetes mellitus without complication (HCC)     .  Hypertension     .  CAD (coronary artery disease)     .  Chronic back pain     .  Hypercholesteremia     .  Gout     .  PAD (peripheral artery disease) (HCC)     .  TIA (transient ischemic attack)     .  CKD (chronic kidney disease)     .  Urinary incontinence     .  Refusal of blood transfusions as patient is Jehovah's Witness     .  UTI (lower urinary tract infection)     .  PDR (proliferative diabetic retinopathy) (HCC)     .  Cataract     .  POAG (primary open-angle glaucoma)     .  Osteopenia     .  Vitamin D deficiency     .  Dementia     .  Left breast mass     .  Anemia     .  Aortic ectasia (HCC)     .  Aortic dissection, abdominal (HCC)     .  Diastolic dysfunction     .  GERD (gastroesophageal reflux disease)     .  Vitamin B 12 deficiency     .  Leg paresthesia     .  Stroke (HCC           Pt is well controlled with home medications.  Problem: Activity: Goal: Risk for activity intolerance will decrease Outcome: Progressing Pt alert and oriented. No c/o pain nor distress noted. [facility]. Daughter at bedside. Continue to monitor.

## 2015-08-02 NOTE — Discharge Summary (Signed)
Jade Baker, 81 y.o., DOB 31-Jan-1931, MRN [id number]. Admission date: 08/01/2015 Discharge Date 08/02/2015 Primary MD Cyndie Mull, DO Admitting Physician Houston Siren, MD  Admission Diagnosis  Elevated troponin I level [R79.89] Syncope, unspecified syncope type [R55]  Discharge Diagnosis   Active Problems:   Syncope possible vasovagal Carotid artery stenosis on the right needs outpatient vascular follow-up Hypertension H/o gout Dementia Peripheral vascular disease Glaucoma GERD Hyperlipidemia       Hospital Course Jade Baker is a 81 y.o. female with a known history of hypertension, chronic kidney disease stage III, chronic back pain, history of gout, peripheral vascular disease, TIA, urinary incontinence, cataracts, osteoporosis, GERD, history of previous CVA, gout presented to the hospital due to a syncopal episode. Patient was placed under observation. She underwent a CT of the head which was negative carotid Doppler shows right-sided carotid stenosis which needs outpatient vascular follow-up. There is no evidence of arrhythmia her echocardiogramshows normal EF. At this time patient is doing better and is stable for discharge.            Consults  None  Significant Tests:  See full reports for all details      Ct Head Wo Contrast  08/02/2015  CLINICAL DATA:  Syncope. EXAM: CT HEAD WITHOUT CONTRAST TECHNIQUE: Contiguous axial images were obtained from the base of the skull through the vertex without intravenous contrast. COMPARISON:  None. FINDINGS: There is atrophy and chronic small vessel disease changes. No acute intracranial abnormality. Specifically, no hemorrhage, hydrocephalus, mass lesion, acute infarction, or significant intracranial injury. No acute calvarial abnormality. Visualized paranasal sinuses and mastoids clear. Orbital soft tissues unremarkable. IMPRESSION: No acute intracranial abnormality. Atrophy, chronic microvascular  disease. Electronically Signed   By: Charlett Nose M.D.   On: 08/02/2015 10:50   US Carotid Bilateral  08/02/2015  CLINICAL DATA:  Syncope. EXAM: BILATERAL CAROTID DUPLEX ULTRASOUND TECHNIQUE: Wallace Cullens scale imaging, color Doppler and duplex ultrasound were performed of bilateral carotid and vertebral arteries in the neck. COMPARISON:  None. FINDINGS: Criteria: Quantification of carotid stenosis is based on velocity parameters that correlate the residual internal carotid diameter with NASCET-based stenosis levels, using the diameter of the distal internal carotid lumen as the denominator for stenosis measurement. The following velocity measurements were obtained: RIGHT ICA:  187/34 cm/sec CCA:  107/14 cm/sec SYSTOLIC ICA/CCA RATIO:  1.8 DIASTOLIC ICA/CCA RATIO:  2.5 ECA:  163 cm/sec LEFT ICA:  134/28 cm/sec CCA:  116/16 cm/sec SYSTOLIC ICA/CCA RATIO:  1.2 DIASTOLIC ICA/CCA RATIO:  1.8 ECA:  149 cm/sec RIGHT CAROTID ARTERY: Moderate diffuse plaque with stenosis in the 50-69% range at the carotid bifurcation. RIGHT VERTEBRAL ARTERY:  Patent with antegrade flow . LEFT CAROTID ARTERY: Mild diffuse plaque. Degree of stenosis less than 50% at the carotid bifurcation . LEFT VERTEBRAL ARTERY:  Patent with antegrade flow . IMPRESSION: 1. 50-69% stenosis right carotid bifurcation. Less than 50% stenosis left carotid bifurcation. No hemodynamically significant stenosis noted. 2. Vertebral arteries are patent antegrade flow. Electronically Signed   By: Maisie Fus  Register   On: 08/02/2015 10:58   Dg Shoulder Left  08/02/2015  CLINICAL DATA:  Pt complains of syncope yesterday, "states she must have fallen and hurt her left shoulder. EXAM: LEFT SHOULDER - 2+ VIEW COMPARISON:  None FINDINGS: There is no evidence of fracture or dislocation. There is no evidence of arthropathy or other focal bone abnormality. Soft tissues are unremarkable. IMPRESSION: Negative. Electronically Signed   By: Norva Pavlov M.D.   On: 08/02/2015 11:16  Today   Subjective:   Jade Baker feels well denies any complaints  Objective:   Blood pressure 161/54, pulse 91, temperature 98.9 F (37.2 C), temperature source Oral, resp. rate 22, height 5' (1.524 m), weight 57.97 kg (127 lb 12.8 oz), SpO2 98 %.  .  Intake/Output Summary (Last 24 hours) at 08/02/15 1427 Last data filed at 08/02/15 0800  Gross per 24 hour  Intake    240 ml  Output      0 ml  Net    240 ml    Exam VITAL SIGNS: Blood pressure 161/54, pulse 91, temperature 98.9 F (37.2 C), temperature source Oral, resp. rate 22, height 5' (1.524 m), weight 57.97 kg (127 lb 12.8 oz), SpO2 98 %.  GENERAL:  81 y.o.-year-old patient lying in the bed with no acute distress.  EYES: Pupils equal, round, reactive to light and accommodation. No scleral icterus. Extraocular muscles intact.  HEENT: Head atraumatic, normocephalic. Oropharynx and nasopharynx clear.  NECK:  Supple, no jugular venous distention. No thyroid enlargement, no tenderness.  LUNGS: Normal breath sounds bilaterally, no wheezing, rales,rhonchi or crepitation. No use of accessory muscles of respiration.  CARDIOVASCULAR: S1, S2 normal. No murmurs, rubs, or gallops.  ABDOMEN: Soft, nontender, nondistended. Bowel sounds present. No organomegaly or mass.  EXTREMITIES: No pedal edema, cyanosis, or clubbing.  NEUROLOGIC: Cranial nerves II through XII are intact. Muscle strength 5/5 in all extremities. Sensation intact. Gait not checked.  PSYCHIATRIC: The patient is alert and oriented x 3.  SKIN: No obvious rash, lesion, or ulcer.   Data Review     CBC w Diff: Lab Results  Component Value Date   WBC 7.0 08/01/2015   HGB 8.1* 08/01/2015   HCT 24.8* 08/01/2015   PLT 223 08/01/2015   LYMPHOPCT 30 08/01/2015   MONOPCT 9 08/01/2015   EOSPCT 3 08/01/2015   BASOPCT 1 08/01/2015   CMP: Lab Results  Component Value Date   NA 138 08/01/2015   K 4.9 08/01/2015   CL 106 08/01/2015   CO2 24 08/01/2015   BUN 49*  08/01/2015   CREATININE 2.67* 08/01/2015   PROT 7.6 08/01/2015   ALBUMIN 4.1 08/01/2015   BILITOT 0.3 08/01/2015   ALKPHOS 78 08/01/2015   AST 22 08/01/2015   ALT 11* 08/01/2015  .  Micro Results No results found for this or any previous visit (from the past 240 hour(s)).      Code Status Orders        Start     Ordered   08/01/15 2029  Full code   Continuous     08/01/15 2028    Advance Directive Documentation        Most Recent Value   Type of Advance Directive  Healthcare Power of Attorney [Daughter: Gerldine Spate]   Pre-existing out of facility DNR order (yellow form or pink MOST form)     "MOST" Form in Place?            Follow-up Information    Follow up with Luvenia StarchSANTAYANA, GLORIA PATRICIA, DO On 08/09/2015.   Specialty:  Family Medicine   Why:  at 1:20   Contact information:   [address] [place] [id number] [phone]       Follow up with vascular surgon at [facility] In 3 weeks.   Why:  for right sided cartoid stenosis      Discharge Medications     Medication List    TAKE these medications  acetaminophen 325 MG tablet  Commonly known as:  TYLENOL  Take 650 mg by mouth every 4 (four) hours as needed for moderate pain.     aspirin EC 81 MG tablet  Take 81 mg by mouth daily.     brimonidine 0.1 % Soln  Commonly known as:  ALPHAGAN P  Place 1 drop into both eyes 3 (three) times daily.     calcium carbonate 1500 (600 CA) MG Tabs tablet  Commonly known as:  OSCAL  Take 600 mg of elemental calcium by mouth daily.     cilostazol 100 MG tablet  Commonly known as:  PLETAL  Take 100 mg by mouth 2 (two) times daily.     ciprofloxacin 0.3 % ophthalmic solution  Commonly known as:  CILOXAN  Place 1 drop into the right eye 4 (four) times daily. Administer 1 drop, every 2 hours, while awake, for 2 days. Then 1 drop, every 4 hours, while awake, for the next 5 days.     ciprofloxacin 500 MG tablet  Commonly known as:  CIPRO  Take 1 tablet  (500 mg total) by mouth 2 (two) times daily.     clopidogrel 75 MG tablet  Commonly known as:  PLAVIX  Take 75 mg by mouth daily.     donepezil 5 MG tablet  Commonly known as:  ARICEPT  Take 5 mg by mouth at bedtime.     febuxostat 40 MG tablet  Commonly known as:  ULORIC  Take 40 mg by mouth daily.     ferrous sulfate 325 (65 FE) MG tablet  Take 325 mg by mouth 2 (two) times daily with a meal.     fluticasone 50 MCG/ACT nasal spray  Commonly known as:  FLONASE  Place 1 spray into both nostrils daily as needed for allergies or rhinitis.     hydrALAZINE 50 MG tablet  Commonly known as:  APRESOLINE  Take 50 mg by mouth 2 (two) times daily.     isosorbide mononitrate 60 MG 24 hr tablet  Commonly known as:  IMDUR  Take 60 mg by mouth daily.     latanoprost 0.005 % ophthalmic solution  Commonly known as:  XALATAN  Place 1 drop into both eyes at bedtime.     lidocaine 2 % jelly  Commonly known as:  XYLOCAINE  Apply 1 application topically as needed (for eye).     lovastatin 40 MG tablet  Commonly known as:  MEVACOR  Take 40 mg by mouth daily.     Melatonin 3 MG Tabs  Take 1 tablet by mouth at bedtime.     memantine 10 MG tablet  Commonly known as:  NAMENDA  Take 10 mg by mouth 2 (two) times daily.     oxyCODONE-acetaminophen 5-325 MG tablet  Commonly known as:  PERCOCET/ROXICET  Take 1 tablet by mouth every 4 (four) hours as needed for severe pain.     pantoprazole 40 MG tablet  Commonly known as:  PROTONIX  Take 40 mg by mouth every 12 (twelve) hours.     PARoxetine 20 MG tablet  Commonly known as:  PAXIL  Take 20 mg by mouth daily.     prednisoLONE acetate 1 % ophthalmic suspension  Commonly known as:  PRED FORTE  Place 1 drop into the right eye 4 (four) times daily.     simethicone 80 MG chewable tablet  Commonly known as:  MYLICON  Chew 80 mg by mouth 4 (four) times daily as  needed for flatulence.     Vitamin D (Cholecalciferol) 1000 UNITS Caps  Take  1 capsule by mouth daily.           Total Time in preparing paper work, data evaluation and todays exam - 35 minutes  Auburn Bilberry M.D on 08/02/2015 at 2:27 [facility]  [facility]   Office  [phone]

## 2015-08-02 NOTE — Care Management Obs Status (Signed)
MEDICARE OBSERVATION STATUS NOTIFICATION   Patient Details  Name: Jade Baker MRN: [id number] Date of Birth: [birth year]   Medicare Observation Status Notification Given:  Yes    Gwenette GreetBrenda S Benny Henrie, RN 08/02/2015, 3:15 PM

## 2015-08-02 NOTE — Discharge Instructions (Signed)
°  DIET:  Cardiac diet  DISCHARGE CONDITION:  Stable  ACTIVITY:  Activity as tolerated  OXYGEN:  Home Oxygen: No.   Oxygen Delivery: room air  DISCHARGE LOCATION:  home    ADDITIONAL DISCHARGE INSTRUCTION: [facility] sided [facility]  If you experience worsening of your admission symptoms, develop shortness of breath, life threatening emergency, suicidal or homicidal thoughts you must seek medical attention immediately by calling 911 or calling your MD immediately  if symptoms less severe.  You Must read complete instructions/literature along with all the possible adverse reactions/side effects for all the Medicines you take and that have been prescribed to you. Take any new Medicines after you have completely understood and accpet all the possible adverse reactions/side effects.   Please note  You were cared for by a hospitalist during your hospital stay. If you have any questions about your discharge medications or the care you received while you were in the hospital after you are discharged, you can call the unit and asked to speak with the hospitalist on call if the hospitalist that took care of you is not available. Once you are discharged, your primary care physician will handle any further medical issues. Please note that NO REFILLS for any discharge medications will be authorized once you are discharged, as it is imperative that you return to your primary care physician (or establish a relationship with a primary care physician if you do not have one) for your aftercare needs so that they can reassess your need for medications and monitor your lab values.

## 2015-08-29 ENCOUNTER — Other Ambulatory Visit
Admission: RE | Admit: 2015-08-29 | Discharge: 2015-08-29 | Disposition: A | Discharge status: Home / Self Care | Payer: Medicare Other | Source: Ambulatory Visit | Attending: Physician Assistant | Admitting: Physician Assistant

## 2015-08-29 DIAGNOSIS — R300 Dysuria: Secondary | ICD-10-CM | POA: Insufficient documentation

## 2015-08-29 LAB — URINALYSIS COMPLETE WITH MICROSCOPIC (ARMC ONLY)
BILIRUBIN URINE: NEGATIVE
GLUCOSE, UA: NEGATIVE mg/dL
Hgb urine dipstick: NEGATIVE
KETONES UR: NEGATIVE mg/dL
Nitrite: NEGATIVE
PH: 7 (ref 5.0–8.0)
Protein, ur: 100 mg/dL — AB
RBC / HPF: NONE SEEN RBC/hpf (ref 0–5)
Specific Gravity, Urine: 1.006 (ref 1.005–1.030)

## 2015-08-31 LAB — URINE CULTURE: Culture: >=100000

## 2016-03-18 ENCOUNTER — Other Ambulatory Visit
Admission: RE | Admit: 2016-03-18 | Discharge: 2016-03-18 | Disposition: A | Discharge status: Home / Self Care | Payer: Medicare Other | Source: Ambulatory Visit | Attending: Physician Assistant | Admitting: Physician Assistant

## 2016-03-18 DIAGNOSIS — M810 Age-related osteoporosis without current pathological fracture: Secondary | ICD-10-CM | POA: Insufficient documentation

## 2016-03-18 DIAGNOSIS — K5900 Constipation, unspecified: Secondary | ICD-10-CM | POA: Insufficient documentation

## 2016-03-18 DIAGNOSIS — R2681 Unsteadiness on feet: Secondary | ICD-10-CM | POA: Insufficient documentation

## 2016-03-18 DIAGNOSIS — M109 Gout, unspecified: Secondary | ICD-10-CM | POA: Diagnosis present

## 2016-03-18 DIAGNOSIS — I10 Essential (primary) hypertension: Secondary | ICD-10-CM | POA: Insufficient documentation

## 2016-03-18 DIAGNOSIS — J309 Allergic rhinitis, unspecified: Secondary | ICD-10-CM | POA: Diagnosis present

## 2016-03-18 DIAGNOSIS — E559 Vitamin D deficiency, unspecified: Secondary | ICD-10-CM | POA: Diagnosis present

## 2016-03-18 DIAGNOSIS — I2510 Atherosclerotic heart disease of native coronary artery without angina pectoris: Secondary | ICD-10-CM | POA: Insufficient documentation

## 2016-03-18 DIAGNOSIS — K219 Gastro-esophageal reflux disease without esophagitis: Secondary | ICD-10-CM | POA: Insufficient documentation

## 2016-03-18 DIAGNOSIS — E119 Type 2 diabetes mellitus without complications: Secondary | ICD-10-CM | POA: Insufficient documentation

## 2016-03-18 DIAGNOSIS — I739 Peripheral vascular disease, unspecified: Secondary | ICD-10-CM | POA: Insufficient documentation

## 2016-03-18 DIAGNOSIS — R52 Pain, unspecified: Secondary | ICD-10-CM | POA: Insufficient documentation

## 2016-03-18 DIAGNOSIS — F329 Major depressive disorder, single episode, unspecified: Secondary | ICD-10-CM | POA: Diagnosis present

## 2016-03-18 DIAGNOSIS — D509 Iron deficiency anemia, unspecified: Secondary | ICD-10-CM | POA: Diagnosis present

## 2016-03-18 DIAGNOSIS — H401113 Primary open-angle glaucoma, right eye, severe stage: Secondary | ICD-10-CM | POA: Diagnosis present

## 2016-03-18 DIAGNOSIS — E785 Hyperlipidemia, unspecified: Secondary | ICD-10-CM | POA: Diagnosis present

## 2016-03-18 DIAGNOSIS — F0281 Dementia in other diseases classified elsewhere with behavioral disturbance: Secondary | ICD-10-CM | POA: Insufficient documentation

## 2016-03-18 DIAGNOSIS — N184 Chronic kidney disease, stage 4 (severe): Secondary | ICD-10-CM | POA: Insufficient documentation

## 2016-03-18 DIAGNOSIS — R079 Chest pain, unspecified: Secondary | ICD-10-CM | POA: Insufficient documentation

## 2016-03-18 DIAGNOSIS — M6281 Muscle weakness (generalized): Secondary | ICD-10-CM | POA: Insufficient documentation

## 2016-03-18 DIAGNOSIS — G4700 Insomnia, unspecified: Secondary | ICD-10-CM | POA: Insufficient documentation

## 2016-03-18 LAB — URINALYSIS COMPLETE WITH MICROSCOPIC (ARMC ONLY)
Bilirubin Urine: NEGATIVE
GLUCOSE, UA: NEGATIVE mg/dL
KETONES UR: NEGATIVE mg/dL
NITRITE: NEGATIVE
Protein, ur: 100 mg/dL — AB
SPECIFIC GRAVITY, URINE: 1.009 (ref 1.005–1.030)
pH: 8 (ref 5.0–8.0)

## 2016-03-19 ENCOUNTER — Other Ambulatory Visit
Admission: RE | Admit: 2016-03-19 | Discharge: 2016-03-19 | Disposition: A | Discharge status: Home / Self Care | Payer: Medicare Other | Source: Ambulatory Visit | Attending: Nurse Practitioner | Admitting: Nurse Practitioner

## 2016-03-19 DIAGNOSIS — R4182 Altered mental status, unspecified: Secondary | ICD-10-CM | POA: Diagnosis present

## 2016-03-19 LAB — CBC WITH DIFFERENTIAL/PLATELET
BASOS ABS: 0 10*3/uL (ref 0–0.1)
Basophils Relative: 0 %
Eosinophils Absolute: 0.3 10*3/uL (ref 0–0.7)
Eosinophils Relative: 4 %
HEMATOCRIT: 28.1 % — AB (ref 35.0–47.0)
HEMOGLOBIN: 8.9 g/dL — AB (ref 12.0–16.0)
LYMPHS PCT: 23 %
Lymphs Abs: 1.9 10*3/uL (ref 1.0–3.6)
MCH: 27.6 pg (ref 26.0–34.0)
MCHC: 31.6 g/dL — ABNORMAL LOW (ref 32.0–36.0)
MCV: 87.2 fL (ref 80.0–100.0)
MONOS PCT: 9 %
Monocytes Absolute: 0.8 10*3/uL (ref 0.2–0.9)
Neutro Abs: 5.4 10*3/uL (ref 1.4–6.5)
Neutrophils Relative %: 64 %
Platelets: 258 10*3/uL (ref 150–440)
RBC: 3.22 MIL/uL — AB (ref 3.80–5.20)
RDW: 15.7 % — ABNORMAL HIGH (ref 11.5–14.5)
WBC: 8.4 10*3/uL (ref 3.6–11.0)

## 2016-03-19 LAB — COMPREHENSIVE METABOLIC PANEL
ALBUMIN: 3.8 g/dL (ref 3.5–5.0)
ALK PHOS: 71 U/L (ref 38–126)
ALT: 13 U/L — ABNORMAL LOW (ref 14–54)
AST: 21 U/L (ref 15–41)
Anion gap: 12 (ref 5–15)
BILIRUBIN TOTAL: 0.3 mg/dL (ref 0.3–1.2)
BUN: 73 mg/dL — AB (ref 6–20)
CALCIUM: 11.1 mg/dL — AB (ref 8.9–10.3)
CO2: 22 mmol/L (ref 22–32)
Chloride: 101 mmol/L (ref 101–111)
Creatinine, Ser: 3.3 mg/dL — ABNORMAL HIGH (ref 0.44–1.00)
GFR calc Af Amer: 14 mL/min — ABNORMAL LOW (ref >60)
GFR calc non Af Amer: 12 mL/min — ABNORMAL LOW (ref >60)
GLUCOSE: 187 mg/dL — AB (ref 65–99)
POTASSIUM: 4.6 mmol/L (ref 3.5–5.1)
Sodium: 135 mmol/L (ref 135–145)
TOTAL PROTEIN: 7.5 g/dL (ref 6.5–8.1)

## 2016-03-21 LAB — URINE CULTURE: Culture: >=100000 — AB

## 2016-03-26 ENCOUNTER — Other Ambulatory Visit
Admission: RE | Admit: 2016-03-26 | Discharge: 2016-03-26 | Disposition: A | Discharge status: Home / Self Care | Payer: Medicare Other | Source: Ambulatory Visit | Attending: Nurse Practitioner | Admitting: Nurse Practitioner

## 2016-03-26 DIAGNOSIS — R5383 Other fatigue: Secondary | ICD-10-CM | POA: Insufficient documentation

## 2016-03-26 LAB — CBC WITH DIFFERENTIAL/PLATELET
BASOS PCT: 1 %
Basophils Absolute: 0.1 10*3/uL (ref 0–0.1)
EOS ABS: 0.1 10*3/uL (ref 0–0.7)
EOS PCT: 2 %
HCT: 30.6 % — ABNORMAL LOW (ref 35.0–47.0)
Hemoglobin: 10.1 g/dL — ABNORMAL LOW (ref 12.0–16.0)
LYMPHS ABS: 1.4 10*3/uL (ref 1.0–3.6)
Lymphocytes Relative: 17 %
MCH: 28.8 pg (ref 26.0–34.0)
MCHC: 33.1 g/dL (ref 32.0–36.0)
MCV: 87.1 fL (ref 80.0–100.0)
MONO ABS: 0.7 10*3/uL (ref 0.2–0.9)
MONOS PCT: 8 %
Neutro Abs: 6.2 10*3/uL (ref 1.4–6.5)
Neutrophils Relative %: 72 %
Platelets: 284 10*3/uL (ref 150–440)
RBC: 3.52 MIL/uL — ABNORMAL LOW (ref 3.80–5.20)
RDW: 16.8 % — AB (ref 11.5–14.5)
WBC: 8.6 10*3/uL (ref 3.6–11.0)

## 2016-03-26 LAB — BASIC METABOLIC PANEL
Anion gap: 10 (ref 5–15)
BUN: 68 mg/dL — AB (ref 6–20)
CALCIUM: 10.7 mg/dL — AB (ref 8.9–10.3)
CHLORIDE: 101 mmol/L (ref 101–111)
CO2: 21 mmol/L — AB (ref 22–32)
CREATININE: 3.93 mg/dL — AB (ref 0.44–1.00)
GFR calc non Af Amer: 10 mL/min — ABNORMAL LOW (ref >60)
GFR, EST AFRICAN AMERICAN: 11 mL/min — AB (ref >60)
GLUCOSE: 207 mg/dL — AB (ref 65–99)
Potassium: 5 mmol/L (ref 3.5–5.1)
Sodium: 132 mmol/L — ABNORMAL LOW (ref 135–145)

## 2016-05-02 IMAGING — CT CT HEAD W/O CM
2 series · 14 of 30 positions shown, 16 images · non-contrast
Comparison: None.

CLINICAL DATA: Syncope.

EXAM:
CT HEAD WITHOUT CONTRAST
TECHNIQUE: Contiguous axial images were obtained from the base of the skull
through the vertex without intravenous contrast.

[Series 2: head wo · axial · 0.37mm/px · z∈[+372,+468]mm · 6 of 27 slices shown, 8 images]
[im 4/27  brain]
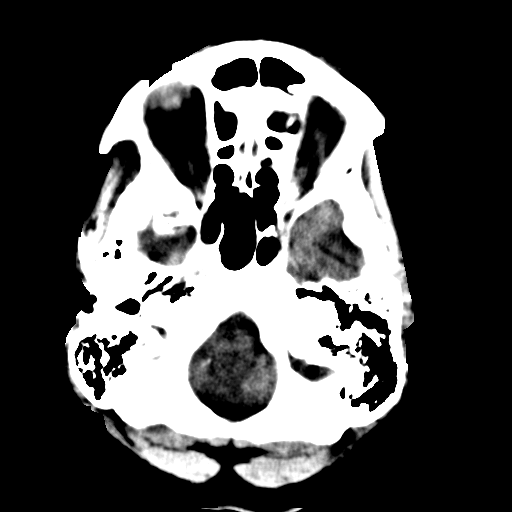
[im 4/27  bone]
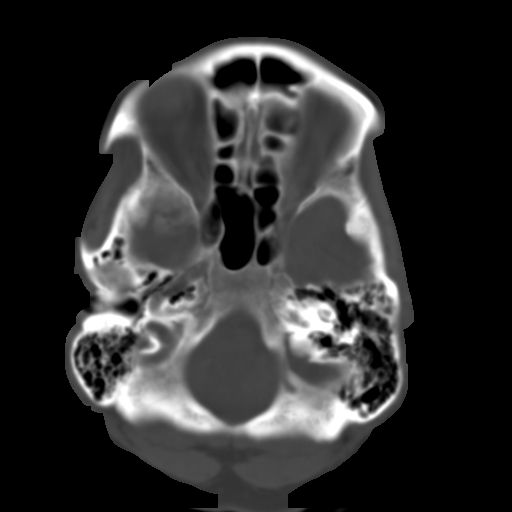
[im 8/27  brain]
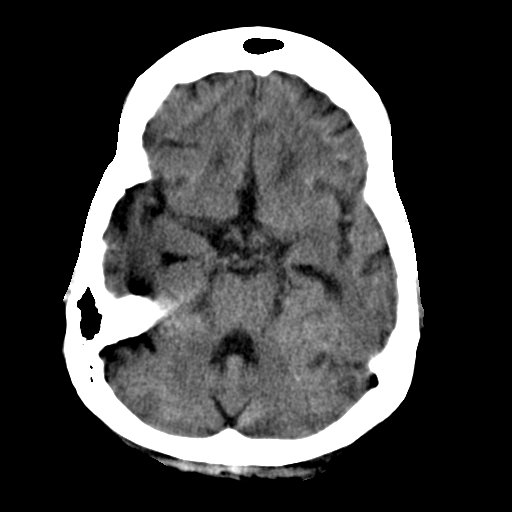
[im 12/27  brain]
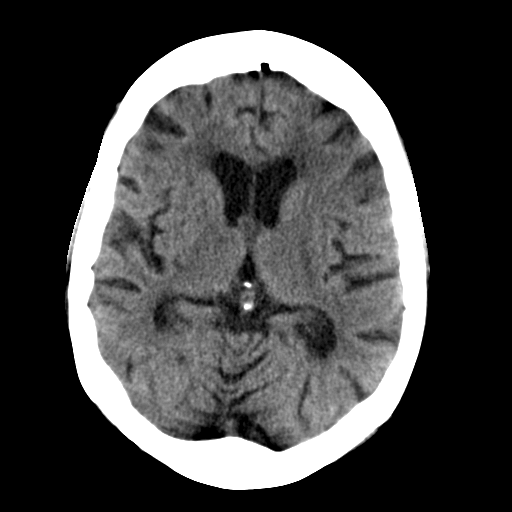
[im 15/27  brain]
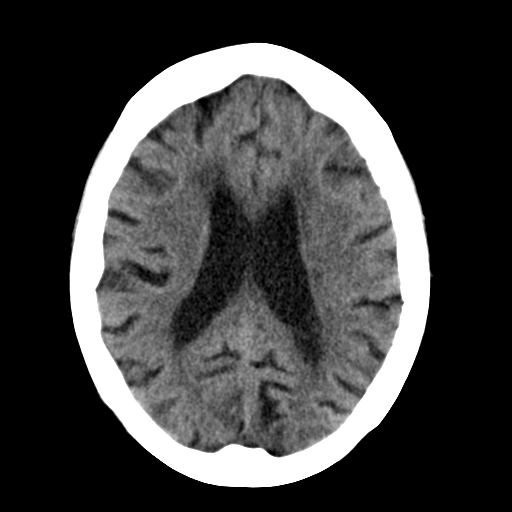
[im 19/27  brain]
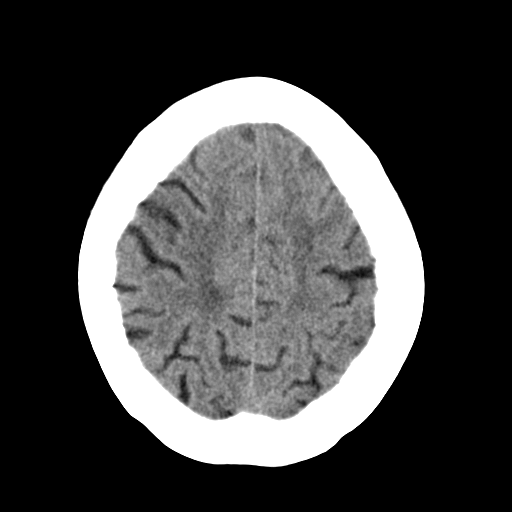
[im 19/27  bone]
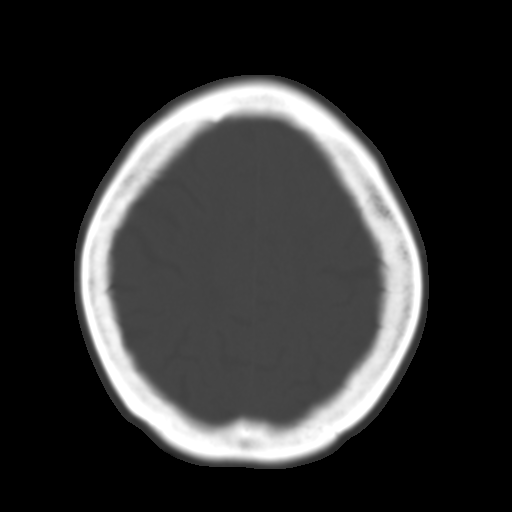
[im 23/27  brain]
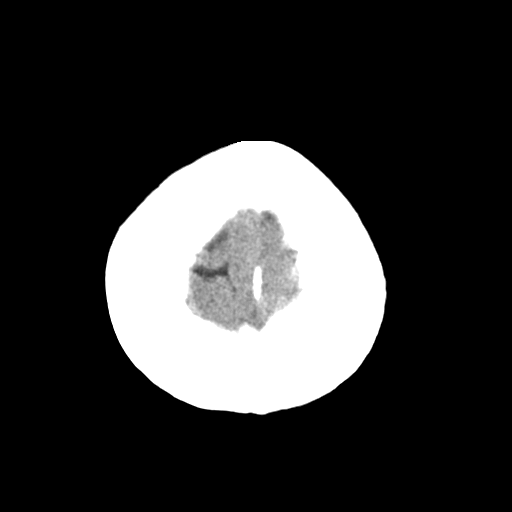

[Series 3: head bone · axial · 0.37mm/px · z∈[+361,+481]mm · 8 of 76 slices shown]
[im 8/76  bone]
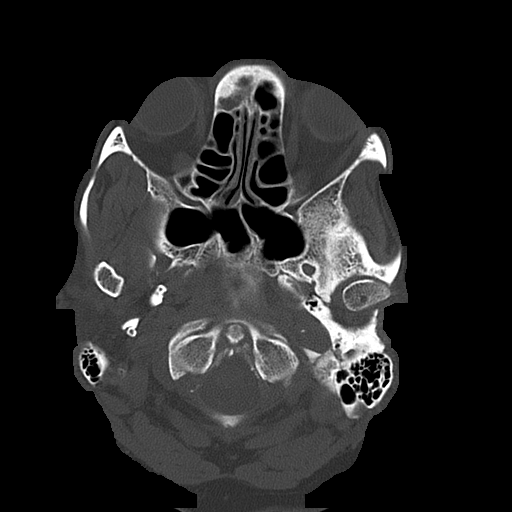
[im 15/76  bone]
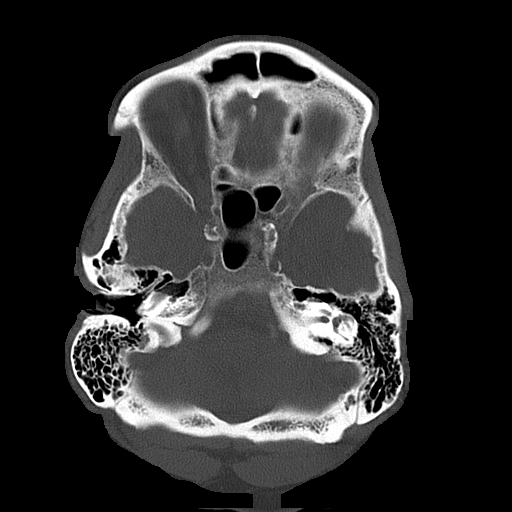
[im 26/76  bone]
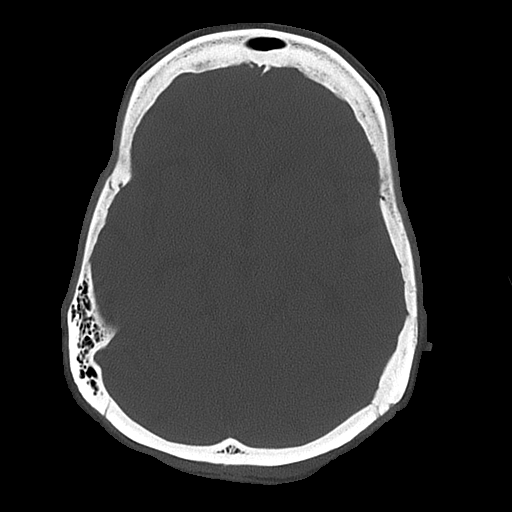
[im 33/76  bone]
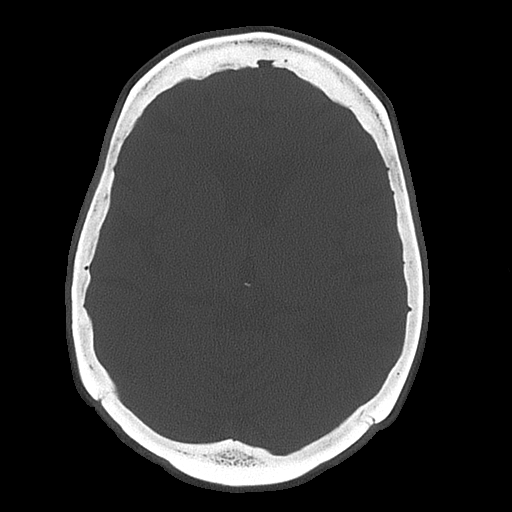
[im 43/76  bone]
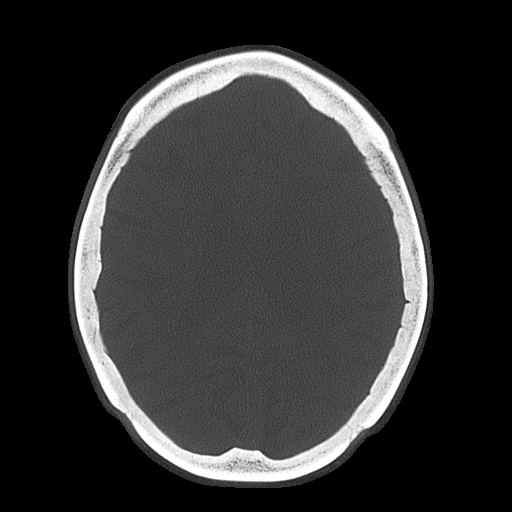
[im 51/76  bone]
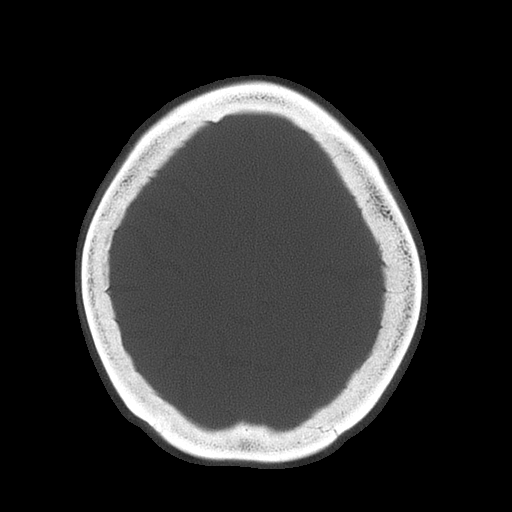
[im 61/76  bone]
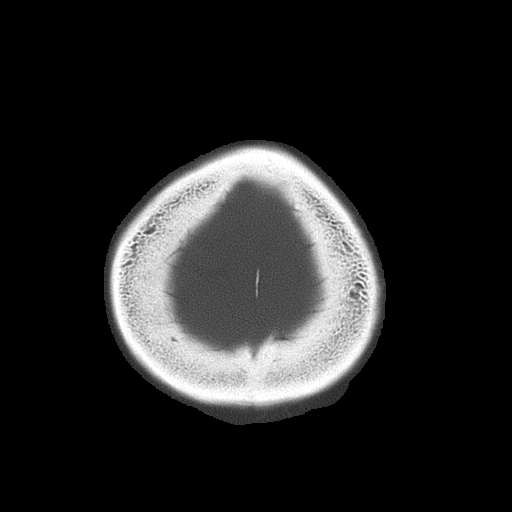
[im 68/76  bone]
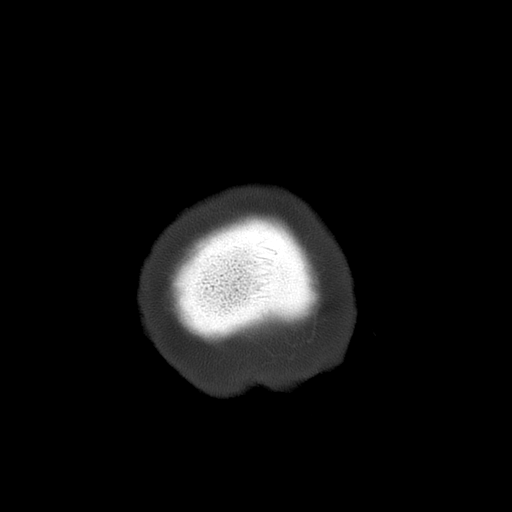

[14 of 30 positions shown; findings below may reference images not displayed]

FINDINGS: There is atrophy and chronic small vessel disease changes. No acute
intracranial abnormality. Specifically, no hemorrhage,
hydrocephalus, mass lesion, acute infarction, or significant
intracranial injury. No acute calvarial abnormality. Visualized
paranasal sinuses and mastoids clear. Orbital soft tissues
unremarkable.
IMPRESSION: No acute intracranial abnormality.

Atrophy, chronic microvascular disease.

## 2016-09-27 DEATH — deceased

## 2017-02-13 IMAGING — US US CAROTID DUPLEX BILAT
1 series · 13 of 24 positions shown · non-contrast
Comparison: None.

CLINICAL DATA: Syncope.

EXAM:
BILATERAL CAROTID DUPLEX ULTRASOUND
TECHNIQUE: Gray scale imaging, color Doppler and duplex ultrasound were
performed of bilateral carotid and vertebral arteries in the neck.

[Series 1: us carotid duplex bilat · 0.07mm/px · 13 of 74 slices shown]
[im 1/74]
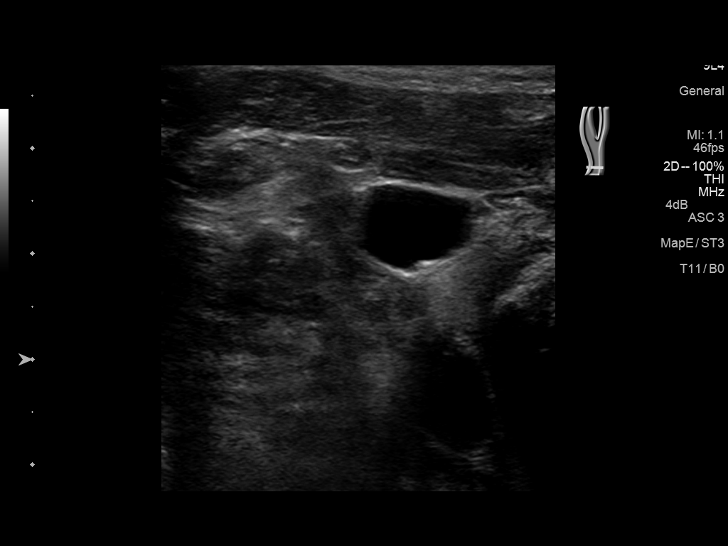
[im 7/74]
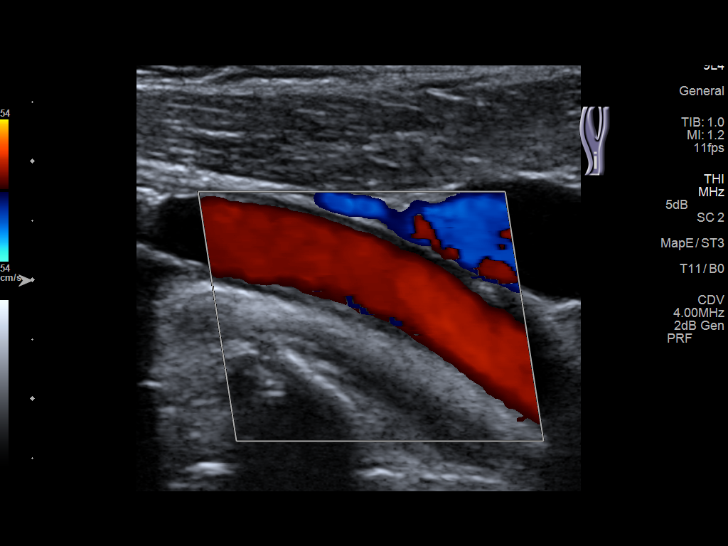
[im 13/74]
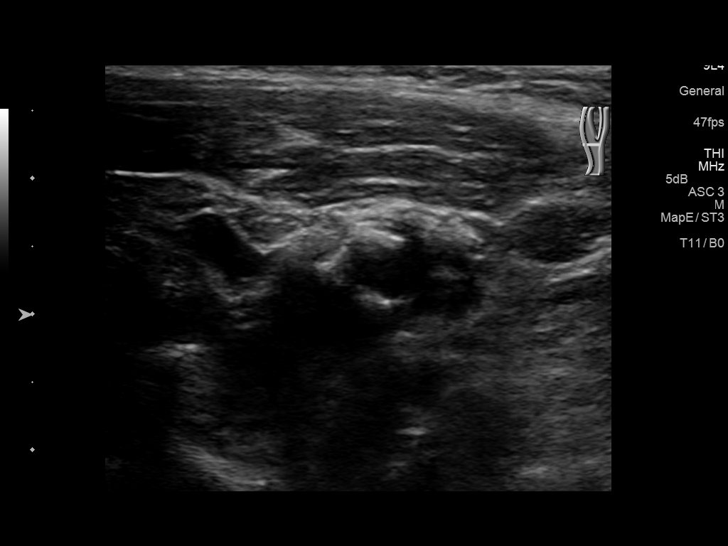
[im 20/74]
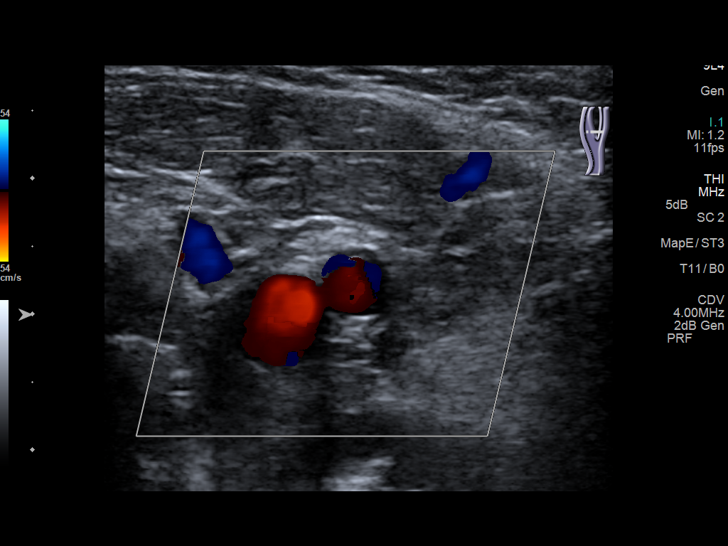
[im 26/74]
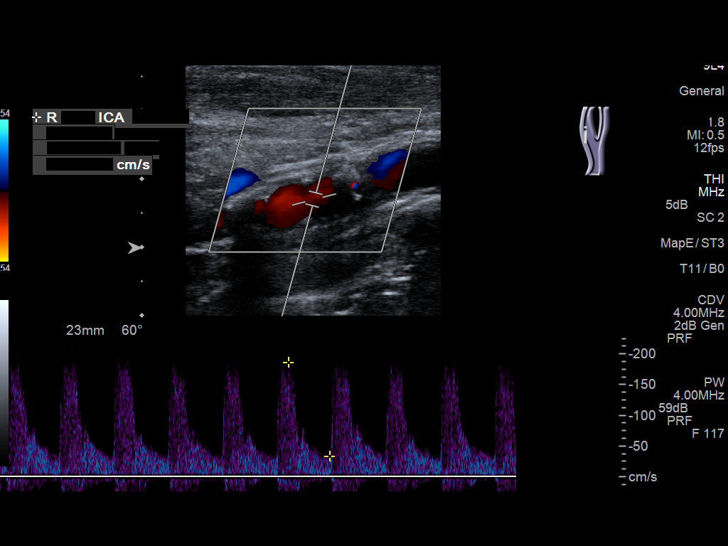
[im 32/74]
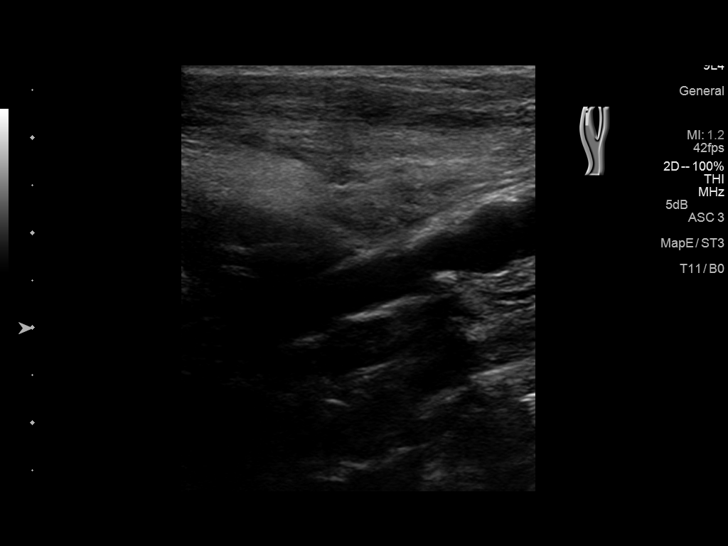
[im 39/74]
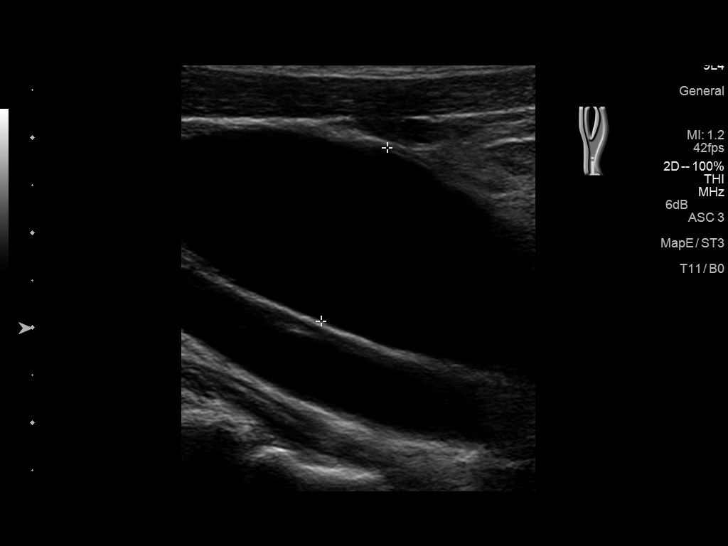
[im 42/74]
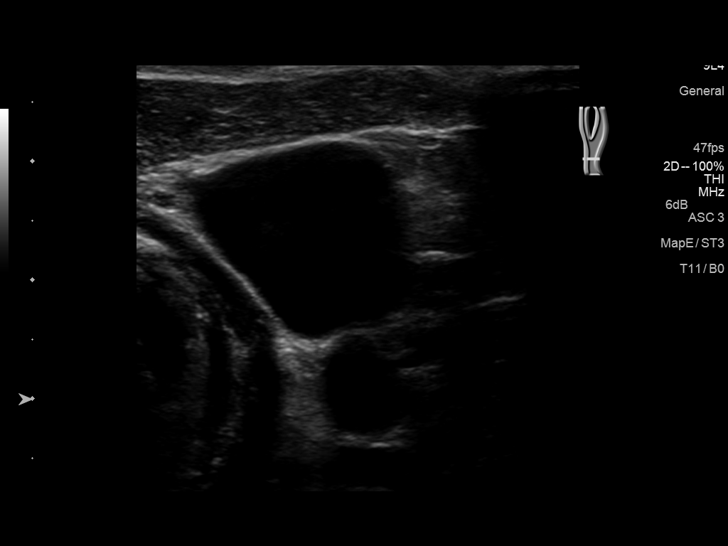
[im 48/74]
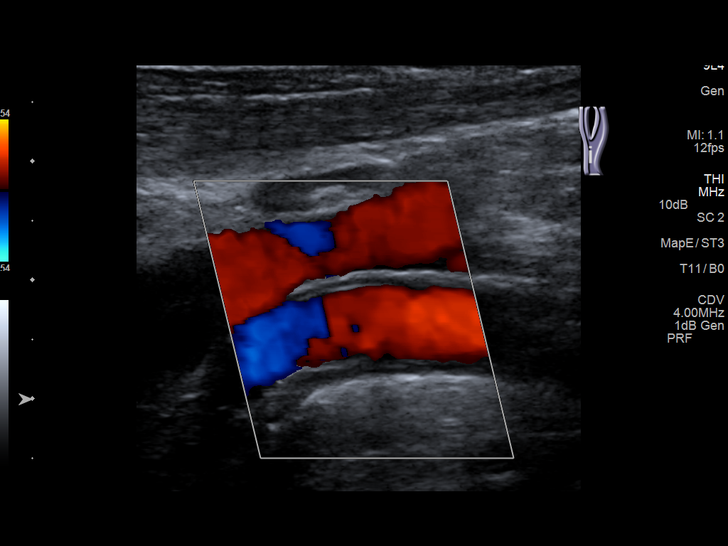
[im 54/74]
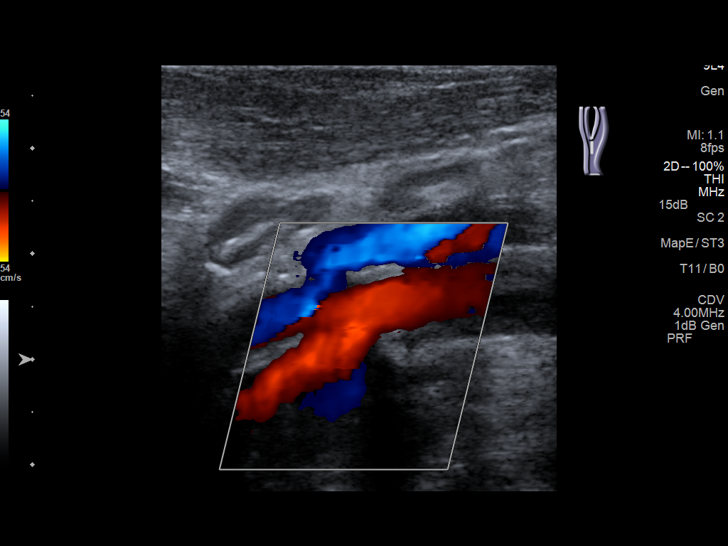
[im 61/74]
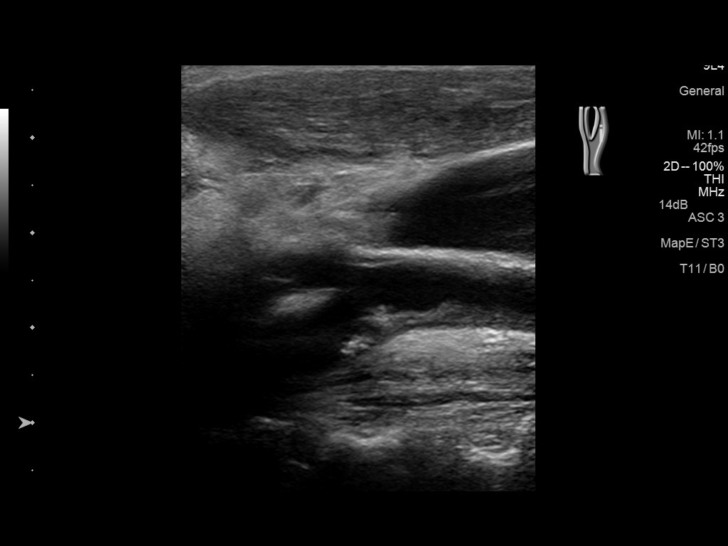
[im 67/74]
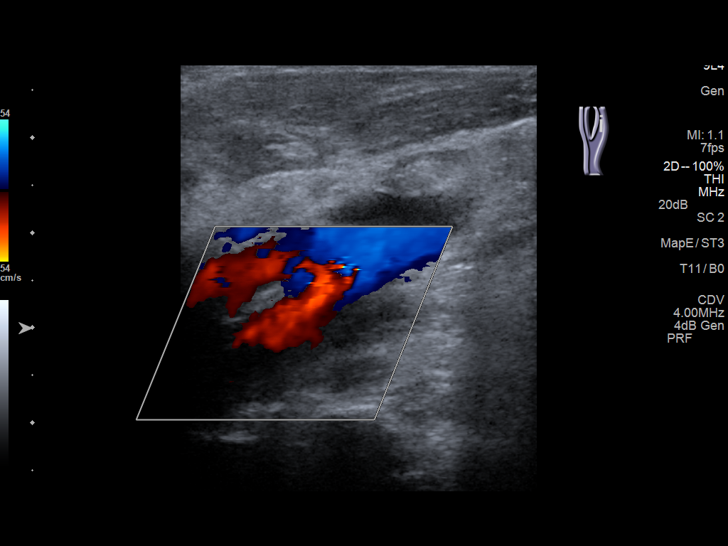
[im 74/74]
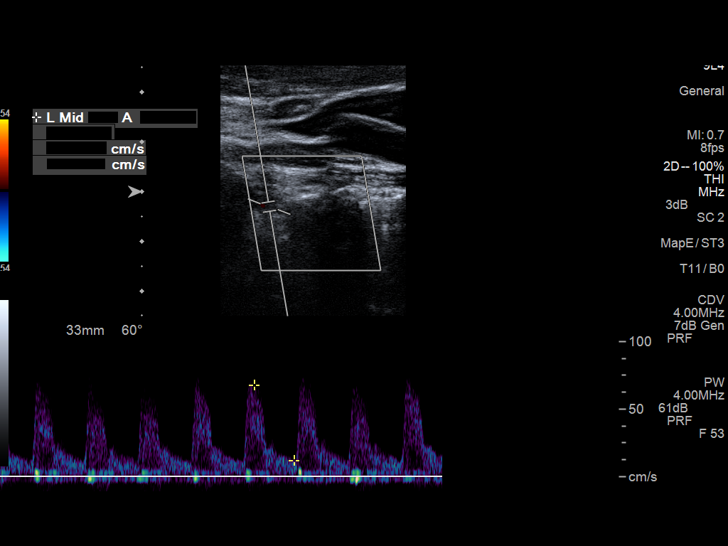

[13 of 24 positions shown; findings below may reference images not displayed]

FINDINGS: Criteria: Quantification of carotid stenosis is based on velocity
parameters that correlate the residual internal carotid diameter
with NASCET-based stenosis levels, using the diameter of the distal
internal carotid lumen as the denominator for stenosis measurement.

The following velocity measurements were obtained:

RIGHT

ICA:  187/34 cm/sec

CCA:  107/14 cm/sec

SYSTOLIC ICA/CCA RATIO:

DIASTOLIC ICA/CCA RATIO:

ECA:  163 cm/sec

LEFT

ICA:  134/28 cm/sec

CCA:  116/16 cm/sec

SYSTOLIC ICA/CCA RATIO:

DIASTOLIC ICA/CCA RATIO:

ECA:  149 cm/sec

RIGHT CAROTID ARTERY: Moderate diffuse plaque with stenosis in the
50-69% range at the carotid bifurcation.

RIGHT VERTEBRAL ARTERY:  Patent with antegrade flow .

LEFT CAROTID ARTERY: Mild diffuse plaque. Degree of stenosis less
than 50% at the carotid bifurcation .

LEFT VERTEBRAL ARTERY:  Patent with antegrade flow .
IMPRESSION: 1. 50-69% stenosis right carotid bifurcation. Less than 50% stenosis
left carotid bifurcation. No hemodynamically significant stenosis
noted.
2. Vertebral arteries are patent antegrade flow.
